# Patient Record
Sex: Female | Born: 1935 | Hispanic: No | Smoking: Never smoker
Health system: Southern US, Community
[De-identification: ages and names within clinical notes are randomized; demographics above are authoritative.]

## PROBLEM LIST (undated history)

## (undated) DIAGNOSIS — E7800 Pure hypercholesterolemia, unspecified: Secondary | ICD-10-CM

## (undated) DIAGNOSIS — K219 Gastro-esophageal reflux disease without esophagitis: Secondary | ICD-10-CM

## (undated) DIAGNOSIS — I639 Cerebral infarction, unspecified: Secondary | ICD-10-CM

---

## 2010-10-03 HISTORY — PX: COLON SURGERY: SHX602

## 2020-09-29 ENCOUNTER — Other Ambulatory Visit: Payer: Self-pay

## 2020-09-29 ENCOUNTER — Encounter (HOSPITAL_BASED_OUTPATIENT_CLINIC_OR_DEPARTMENT_OTHER): Payer: Self-pay | Admitting: *Deleted

## 2020-09-29 ENCOUNTER — Inpatient Hospital Stay (HOSPITAL_BASED_OUTPATIENT_CLINIC_OR_DEPARTMENT_OTHER)
Admission: EM | Admit: 2020-09-29 | Discharge: 2020-10-01 | DRG: 065 | Disposition: A | Discharge status: Home Health | Payer: Medicare Other | Attending: Internal Medicine | Admitting: Internal Medicine

## 2020-09-29 ENCOUNTER — Emergency Department (HOSPITAL_BASED_OUTPATIENT_CLINIC_OR_DEPARTMENT_OTHER): Payer: Medicare Other

## 2020-09-29 DIAGNOSIS — R030 Elevated blood-pressure reading, without diagnosis of hypertension: Secondary | ICD-10-CM | POA: Diagnosis present

## 2020-09-29 DIAGNOSIS — Z20822 Contact with and (suspected) exposure to covid-19: Secondary | ICD-10-CM | POA: Diagnosis present

## 2020-09-29 DIAGNOSIS — I63412 Cerebral infarction due to embolism of left middle cerebral artery: Secondary | ICD-10-CM | POA: Diagnosis not present

## 2020-09-29 DIAGNOSIS — I639 Cerebral infarction, unspecified: Secondary | ICD-10-CM

## 2020-09-29 DIAGNOSIS — I63512 Cerebral infarction due to unspecified occlusion or stenosis of left middle cerebral artery: Secondary | ICD-10-CM | POA: Diagnosis not present

## 2020-09-29 DIAGNOSIS — G8191 Hemiplegia, unspecified affecting right dominant side: Secondary | ICD-10-CM | POA: Diagnosis present

## 2020-09-29 DIAGNOSIS — R471 Dysarthria and anarthria: Secondary | ICD-10-CM | POA: Diagnosis not present

## 2020-09-29 DIAGNOSIS — R29810 Facial weakness: Secondary | ICD-10-CM | POA: Diagnosis not present

## 2020-09-29 DIAGNOSIS — I672 Cerebral atherosclerosis: Secondary | ICD-10-CM | POA: Diagnosis not present

## 2020-09-29 DIAGNOSIS — K219 Gastro-esophageal reflux disease without esophagitis: Secondary | ICD-10-CM | POA: Diagnosis present

## 2020-09-29 DIAGNOSIS — Z79899 Other long term (current) drug therapy: Secondary | ICD-10-CM | POA: Diagnosis not present

## 2020-09-29 DIAGNOSIS — R29704 NIHSS score 4: Secondary | ICD-10-CM | POA: Diagnosis present

## 2020-09-29 DIAGNOSIS — R7303 Prediabetes: Secondary | ICD-10-CM | POA: Diagnosis present

## 2020-09-29 HISTORY — DX: Gastro-esophageal reflux disease without esophagitis: K21.9

## 2020-09-29 LAB — COMPREHENSIVE METABOLIC PANEL
ALT: 16 U/L (ref 0–44)
AST: 23 U/L (ref 15–41)
Albumin: 4.1 g/dL (ref 3.5–5.0)
Alkaline Phosphatase: 65 U/L (ref 38–126)
Anion gap: 8 (ref 5–15)
BUN: 11 mg/dL (ref 8–23)
CO2: 26 mmol/L (ref 22–32)
Calcium: 8.7 mg/dL — ABNORMAL LOW (ref 8.9–10.3)
Chloride: 105 mmol/L (ref 98–111)
Creatinine, Ser: 0.81 mg/dL (ref 0.44–1.00)
GFR, Estimated: >60 mL/min (ref >60)
Glucose, Bld: 127 mg/dL — ABNORMAL HIGH (ref 70–99)
Potassium: 3.8 mmol/L (ref 3.5–5.1)
Sodium: 139 mmol/L (ref 135–145)
Total Bilirubin: 0.5 mg/dL (ref 0.3–1.2)
Total Protein: 7.2 g/dL (ref 6.5–8.1)

## 2020-09-29 LAB — DIFFERENTIAL
Abs Immature Granulocytes: 0.01 10*3/uL (ref 0.00–0.07)
Basophils Absolute: 0 10*3/uL (ref 0.0–0.1)
Basophils Relative: 0 %
Eosinophils Absolute: 0.1 10*3/uL (ref 0.0–0.5)
Eosinophils Relative: 2 %
Immature Granulocytes: 0 %
Lymphocytes Relative: 30 %
Lymphs Abs: 1.9 10*3/uL (ref 0.7–4.0)
Monocytes Absolute: 0.6 10*3/uL (ref 0.1–1.0)
Monocytes Relative: 10 %
Neutro Abs: 3.6 10*3/uL (ref 1.7–7.7)
Neutrophils Relative %: 58 %

## 2020-09-29 LAB — URINALYSIS, ROUTINE W REFLEX MICROSCOPIC
Bilirubin Urine: NEGATIVE
Glucose, UA: NEGATIVE mg/dL
Hgb urine dipstick: NEGATIVE
Ketones, ur: NEGATIVE mg/dL
Leukocytes,Ua: NEGATIVE
Nitrite: NEGATIVE
Protein, ur: NEGATIVE mg/dL
Specific Gravity, Urine: 1.005 (ref 1.005–1.030)
pH: 8 (ref 5.0–8.0)

## 2020-09-29 LAB — CBC
HCT: 39.2 % (ref 36.0–46.0)
Hemoglobin: 13.2 g/dL (ref 12.0–15.0)
MCH: 29.3 pg (ref 26.0–34.0)
MCHC: 33.7 g/dL (ref 30.0–36.0)
MCV: 87.1 fL (ref 80.0–100.0)
Platelets: 210 10*3/uL (ref 150–400)
RBC: 4.5 MIL/uL (ref 3.87–5.11)
RDW: 13.2 % (ref 11.5–15.5)
WBC: 6.2 10*3/uL (ref 4.0–10.5)
nRBC: 0 % (ref 0.0–0.2)

## 2020-09-29 LAB — RAPID URINE DRUG SCREEN, HOSP PERFORMED
Amphetamines: NOT DETECTED
Barbiturates: NOT DETECTED
Benzodiazepines: NOT DETECTED
Cocaine: NOT DETECTED
Opiates: NOT DETECTED
Tetrahydrocannabinol: NOT DETECTED

## 2020-09-29 LAB — RESP PANEL BY RT-PCR (FLU A&B, COVID) ARPGX2
Influenza A by PCR: NEGATIVE
Influenza B by PCR: NEGATIVE
SARS Coronavirus 2 by RT PCR: NEGATIVE

## 2020-09-29 LAB — TROPONIN I (HIGH SENSITIVITY): Troponin I (High Sensitivity): 4 ng/L (ref <18)

## 2020-09-29 LAB — ETHANOL: Alcohol, Ethyl (B): <10 mg/dL (ref <10)

## 2020-09-29 MED ORDER — IOHEXOL 350 MG/ML SOLN
100.0000 mL | Freq: Once | INTRAVENOUS | Status: AC | PRN
  Administered 2020-09-29: 19:00:00 100 mL via INTRAVENOUS

## 2020-09-29 MED ORDER — PANTOPRAZOLE SODIUM 40 MG PO TBEC
40.0000 mg | DELAYED_RELEASE_TABLET | Freq: Every day | ORAL | Status: DC
  Administered 2020-09-30 – 2020-10-01 (×2): 40 mg via ORAL
  Filled 2020-09-29 (×2): qty 1

## 2020-09-29 MED ORDER — ASPIRIN 325 MG PO TABS
325.0000 mg | ORAL_TABLET | Freq: Once | ORAL | Status: AC
  Administered 2020-09-30: 07:00:00 325 mg via ORAL
  Filled 2020-09-29: qty 1

## 2020-09-29 NOTE — ED Notes (Signed)
Son informed there were no admit beds available tonight and pt would remain here until tomorrow. Son verbalized understanding.

## 2020-09-29 NOTE — ED Provider Notes (Signed)
[facility] EMERGENCY DEPARTMENT Provider Note   CSN: [id number] Arrival date & time: 09/29/20  1707     History Chief Complaint  Patient presents with  . Weakness    Kelly [name] is a 86 y.o. female here with weakness.  Patient states that she has been having some congestion and cough for the last several days.  She was noted to have right-sided weakness's for the last 4 to 5 days.  She saw her doctor and was given a Z-Pak.  She also was tested for Covid yesterday and was negative.  However yesterday, the son noticed that she has a facial droop on the right side.  She also has some trouble speaking as well.  She never has a history of stroke and son was concerned that she has a stroke now.   The history is provided by the patient.  Translation done by the son      Past Medical History:  Diagnosis Date  . GERD (gastroesophageal reflux disease)     There are no problems to display for this patient.   History reviewed. No pertinent surgical history.   OB History   No obstetric history on file.     No family history on file.  Social History   Tobacco Use  . Smoking status: Never Smoker  . Smokeless tobacco: Never Used  Substance Use Topics  . Alcohol use: Yes  . Drug use: Never    Home Medications Prior to Admission medications   Medication Sig Start Date End Date Taking? Authorizing Provider  pantoprazole (PROTONIX) 40 MG tablet TAKE ONE TABLET TWICE DAILY 30 MINUTES BEFORE AM/PM MEALS 11/11/19  Yes [provider]    Allergies    Patient has no known allergies.  Review of Systems   Review of Systems  Neurological: Positive for speech difficulty and weakness.  All other systems reviewed and are negative.   Physical Exam Updated Vital Signs BP (!) 159/70 (BP Location: Right Arm)   Pulse 76   Temp 98.2 F (36.8 C) (Oral)   Resp 17   SpO2 97%   Physical Exam Vitals and nursing note reviewed.  Constitutional:      Appearance: Normal  appearance.  HENT:     Head: Normocephalic.     Nose: Nose normal.     Mouth/Throat:     Mouth: Mucous membranes are moist.  Eyes:     Extraocular Movements: Extraocular movements intact.     Pupils: Pupils are equal, round, and reactive to light.  Cardiovascular:     Rate and Rhythm: Normal rate and regular rhythm.     Pulses: Normal pulses.     Heart sounds: Normal heart sounds.  Pulmonary:     Effort: Pulmonary effort is normal.     Breath sounds: Normal breath sounds.  Abdominal:     General: Abdomen is flat.     Palpations: Abdomen is soft.  Musculoskeletal:        General: Normal range of motion.     Cervical back: Normal range of motion and neck supple.  Skin:    General: Skin is warm.     Capillary Refill: Capillary refill takes less than 2 seconds.  Neurological:     Mental Status: She is alert.     Comments: R facial droop, R pronator drift, + slurred speech per the son.   Psychiatric:        Mood and Affect: Mood normal.  Behavior: Behavior normal.     ED Results / Procedures / Treatments   Labs (all labs ordered are listed, but only abnormal results are displayed) Labs Reviewed  COMPREHENSIVE METABOLIC PANEL - Abnormal; Notable for the following components:      Result Value   Glucose, Bld 127 (*)    Calcium 8.7 (*)    All other components within normal limits  RESP PANEL BY RT-PCR (FLU A&B, COVID) ARPGX2  ETHANOL  CBC  DIFFERENTIAL  RAPID URINE DRUG SCREEN, HOSP PERFORMED  URINALYSIS, ROUTINE W REFLEX MICROSCOPIC  TROPONIN I (HIGH SENSITIVITY)    EKG None  Radiology CT Angio Head W or Wo Contrast  Result Date: 09/29/2020 CLINICAL DATA:  Right-sided weakness EXAM: CT ANGIOGRAPHY HEAD AND NECK TECHNIQUE: Multidetector CT imaging of the head and neck was performed using the standard protocol during bolus administration of intravenous contrast. Multiplanar CT image reconstructions and MIPs were obtained to evaluate the vascular anatomy.  Carotid stenosis measurements (when applicable) are obtained utilizing NASCET criteria, using the distal internal carotid diameter as the denominator. CONTRAST:  100mL OMNIPAQUE IOHEXOL 350 MG/ML SOLN COMPARISON:  None. FINDINGS: CTA NECK FINDINGS SKELETON: There is no bony spinal canal stenosis. No lytic or blastic lesion. OTHER NECK: Normal pharynx, larynx and major salivary glands. No cervical lymphadenopathy. Unremarkable thyroid gland. UPPER CHEST: No pneumothorax or pleural effusion. No nodules or masses. AORTIC ARCH: There is calcific atherosclerosis of the aortic arch. There is no aneurysm, dissection or hemodynamically significant stenosis of the visualized portion of the aorta. Conventional 3 vessel aortic branching pattern. The visualized proximal subclavian arteries are widely patent. RIGHT CAROTID SYSTEM: Normal without aneurysm, dissection or stenosis. LEFT CAROTID SYSTEM: Normal without aneurysm, dissection or stenosis. VERTEBRAL ARTERIES: Left dominant configuration. Both origins are clearly patent. There is no dissection, occlusion or flow-limiting stenosis to the skull base (V1-V3 segments). CTA HEAD FINDINGS POSTERIOR CIRCULATION: --Vertebral arteries: Normal V4 segments. --Inferior cerebellar arteries: Normal. 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 --Superior cerebellar arteries: Normal. --Posterior cerebral arteries (PCA): Moderate left P1 P2 junction stenosis. Normal right PCA. ANTERIOR CIRCULATION: --Intracranial internal carotid arteries: Normal. --Anterior cerebral arteries (ACA): Normal. Both A1 segments are present. Patent anterior communicating artery (a-comm). --Middle cerebral arteries (MCA): There is occlusion of the left M1 segment proximally. There is good collateral flow with patency of most M2 branches. The right MCA is mildly narrowed at the M1 M2 junction. VENOUS SINUSES: As permitted by contrast timing, patent. ANATOMIC VARIANTS: None Review  of the MIP images confirms the above findings. IMPRESSION: 1. 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 Critical Value/emergent results were called by telephone at the time of interpretation on 09/29/2020 at 7:16 pm to provider Hill Mackie , who verbally acknowledged these results. 2. 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 3. Moderate left PCA P1 P2 junction stenosis. Aortic Atherosclerosis (ICD10-I70.0). Electronically Signed   By: Deatra RobinsonKevin  Herman M.D.   On: 09/29/2020 19:17   DG Chest 2 View  Result Date: 09/29/2020 CLINICAL DATA:  Weakness EXAM: CHEST - 2 VIEW COMPARISON:  12/03/2012 FINDINGS: Streaky atelectasis or scar at the lingula. No focal consolidation or effusion. Stable cardiomediastinal silhouette. Aortic atherosclerosis. No pneumothorax. IMPRESSION: Streaky atelectasis or scar at the lingula. Electronically Signed   By: Jasmine PangKim  Fujinaga M.D.   On: 09/29/2020 19:11   CT Angio Neck W and/or Wo Contrast  Result Date: 09/29/2020 CLINICAL DATA:  Right-sided weakness EXAM: CT ANGIOGRAPHY  HEAD AND NECK TECHNIQUE: Multidetector CT imaging of the head and neck was performed using the standard protocol during bolus administration of intravenous contrast. Multiplanar CT image reconstructions and MIPs were obtained to evaluate the vascular anatomy. Carotid stenosis measurements (when applicable) are obtained utilizing NASCET criteria, using the distal internal carotid diameter as the denominator. CONTRAST:  OMNIPAQUE IOHEXOL 350 MG/ML SOLN COMPARISON:  None. FINDINGS: CTA NECK FINDINGS SKELETON: There is no bony spinal canal stenosis. No lytic or blastic lesion. OTHER NECK: Normal pharynx, larynx and major salivary glands. No cervical lymphadenopathy. Unremarkable thyroid gland. UPPER CHEST: No pneumothorax or pleural effusion. No nodules or masses. AORTIC ARCH: There is calcific atherosclerosis of the aortic arch. There is no aneurysm, dissection or  hemodynamically significant stenosis of the visualized portion of the aorta. Conventional 3 vessel aortic branching pattern. The visualized proximal subclavian arteries are widely patent. RIGHT CAROTID SYSTEM: Normal without aneurysm, dissection or stenosis. LEFT CAROTID SYSTEM: Normal without aneurysm, dissection or stenosis. VERTEBRAL ARTERIES: Left dominant configuration. Both origins are clearly patent. There is no dissection, occlusion or flow-limiting stenosis to the skull base (V1-V3 segments). CTA HEAD FINDINGS POSTERIOR CIRCULATION: --Vertebral arteries: Normal V4 segments. --Inferior cerebellar arteries: Normal. 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 --Superior cerebellar arteries: Normal. --Posterior cerebral arteries (PCA): Moderate left P1 P2 junction stenosis. Normal right PCA. ANTERIOR CIRCULATION: --Intracranial internal carotid arteries: Normal. --Anterior cerebral arteries (ACA): Normal. Both A1 segments are present. Patent anterior communicating artery (a-comm). --Middle cerebral arteries (MCA): There is occlusion of the left M1 segment proximally. There is good collateral flow with patency of most M2 branches. The right MCA is mildly narrowed at the M1 M2 junction. VENOUS SINUSES: As permitted by contrast timing, patent. ANATOMIC VARIANTS: None Review of the MIP images confirms the above findings. IMPRESSION: 1. 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 Critical Value/emergent results were called by telephone at the time of interpretation on 09/29/2020 at 7:16 pm to provider Onetta Spainhower , who verbally acknowledged these results. 2. 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 3. Moderate left PCA P1 P2 junction stenosis. Aortic Atherosclerosis (ICD10-I70.0). Electronically Signed   By: Deatra Robinson M.D.   On: 09/29/2020 19:17    Procedures Procedures (including critical care time)  Medications Ordered in  ED Medications  iohexol (OMNIPAQUE) 350 MG/ML injection 100 mL (100 mLs Intravenous Contrast Given 09/29/20 1848)    ED Course  I have reviewed the triage vital signs and the nursing notes.  Pertinent labs & imaging results that were available during my care of the patient were reviewed by me and considered in my medical decision making (see chart for details).    MDM Rules/Calculators/A&P                          Kelly [name] is a 86 y.o. female here with weakness, slurred speech. She has R facial droop and R pronator drift as well. Symptoms worsening over 3-4 days, not in the window for TPA. Will get CTA head/neck, will likely need admission for stroke workup   7:27 PM Labs unremarkable. CTA showed L M1 occlusion with collateralization. Symptoms for 4-5 days already. Talked to Dr. Wilford Corner from neurology. He reviewed the scans and doesn't recommend any acute intervention and patient has collaterals already so is not interventional candidate. Hospitalist to admit at [facility] for stroke workup    Final Clinical Impression(s) / ED  Diagnoses Final diagnoses:  None    Rx / DC Orders ED Discharge Orders    None       Charlynne Pander, MD 09/29/20 [phone]

## 2020-09-29 NOTE — ED Triage Notes (Signed)
4 days ago her son states it thinks she had a stroke. It started as a cough and runny nose. She had a virtual visit by her MD and given a Zpak. Her Covid was negative. She has not been using her right arm or talking since Friday.

## 2020-09-30 ENCOUNTER — Observation Stay (HOSPITAL_COMMUNITY): Payer: Medicare Other

## 2020-09-30 DIAGNOSIS — Z20822 Contact with and (suspected) exposure to covid-19: Secondary | ICD-10-CM | POA: Diagnosis not present

## 2020-09-30 DIAGNOSIS — R030 Elevated blood-pressure reading, without diagnosis of hypertension: Secondary | ICD-10-CM | POA: Diagnosis not present

## 2020-09-30 DIAGNOSIS — Z79899 Other long term (current) drug therapy: Secondary | ICD-10-CM | POA: Diagnosis not present

## 2020-09-30 DIAGNOSIS — K219 Gastro-esophageal reflux disease without esophagitis: Secondary | ICD-10-CM | POA: Diagnosis present

## 2020-09-30 DIAGNOSIS — G8191 Hemiplegia, unspecified affecting right dominant side: Secondary | ICD-10-CM | POA: Diagnosis not present

## 2020-09-30 DIAGNOSIS — R29704 NIHSS score 4: Secondary | ICD-10-CM | POA: Diagnosis not present

## 2020-09-30 DIAGNOSIS — I639 Cerebral infarction, unspecified: Secondary | ICD-10-CM

## 2020-09-30 DIAGNOSIS — R471 Dysarthria and anarthria: Secondary | ICD-10-CM | POA: Diagnosis not present

## 2020-09-30 DIAGNOSIS — R7303 Prediabetes: Secondary | ICD-10-CM | POA: Diagnosis present

## 2020-09-30 DIAGNOSIS — I672 Cerebral atherosclerosis: Secondary | ICD-10-CM | POA: Diagnosis not present

## 2020-09-30 DIAGNOSIS — R29810 Facial weakness: Secondary | ICD-10-CM | POA: Diagnosis not present

## 2020-09-30 DIAGNOSIS — I63412 Cerebral infarction due to embolism of left middle cerebral artery: Secondary | ICD-10-CM | POA: Diagnosis not present

## 2020-09-30 DIAGNOSIS — I63512 Cerebral infarction due to unspecified occlusion or stenosis of left middle cerebral artery: Secondary | ICD-10-CM | POA: Diagnosis not present

## 2020-09-30 LAB — HEMOGLOBIN A1C
Hgb A1c MFr Bld: 6 % — ABNORMAL HIGH (ref 4.8–5.6)
Mean Plasma Glucose: 125.5 mg/dL

## 2020-09-30 LAB — LIPID PANEL
Cholesterol: 254 mg/dL — ABNORMAL HIGH (ref 0–200)
HDL: 41 mg/dL (ref >40)
LDL Cholesterol: 180 mg/dL — ABNORMAL HIGH (ref 0–99)
Total CHOL/HDL Ratio: 6.2 RATIO
Triglycerides: 167 mg/dL — ABNORMAL HIGH (ref <150)
VLDL: 33 mg/dL (ref 0–40)

## 2020-09-30 MED ORDER — HYDRALAZINE HCL 20 MG/ML IJ SOLN: 10.0000 mg | INTRAMUSCULAR | Status: DC | PRN

## 2020-09-30 MED ORDER — ASPIRIN EC 81 MG PO TBEC: 81.0000 mg | DELAYED_RELEASE_TABLET | Freq: Every day | ORAL | Status: DC

## 2020-09-30 MED ORDER — SODIUM CHLORIDE 0.9 % IV SOLN: Freq: Once | INTRAVENOUS | Status: AC

## 2020-09-30 MED ORDER — ACETAMINOPHEN 325 MG PO TABS: 650.0000 mg | ORAL_TABLET | ORAL | Status: DC | PRN

## 2020-09-30 MED ORDER — ENOXAPARIN SODIUM 40 MG/0.4ML ~~LOC~~ SOLN
40.0000 mg | SUBCUTANEOUS | Status: DC
  Administered 2020-10-01: 18:00:00 40 mg via SUBCUTANEOUS
  Filled 2020-09-30: qty 0.4

## 2020-09-30 MED ORDER — ACETAMINOPHEN 650 MG RE SUPP: 650.0000 mg | RECTAL | Status: DC | PRN

## 2020-09-30 MED ORDER — CALCIUM GLUCONATE-NACL 1-0.675 GM/50ML-% IV SOLN
1.0000 g | Freq: Once | INTRAVENOUS | Status: AC
  Administered 2020-09-30: 21:00:00 1000 mg via INTRAVENOUS
  Filled 2020-09-30: qty 50

## 2020-09-30 MED ORDER — STROKE: EARLY STAGES OF RECOVERY BOOK
Freq: Once | Status: DC
  Filled 2020-09-30: qty 1

## 2020-09-30 MED ORDER — ACETAMINOPHEN 160 MG/5ML PO SOLN
650.0000 mg | ORAL | Status: DC | PRN
Start: 2020-09-30 — End: 2020-10-01

## 2020-09-30 NOTE — H&P (Addendum)
History and Physical    Kelly [name] VZD:[id number] DOB: 06/17/1934 DOA: 09/29/2020  Referring MD/NP/PA: Victorino Dike, MD PCP: Patient, No Pcp Per  Patient coming from: Transfer from [facility]  Chief Complaint:   weakness  I have personally briefly reviewed patient's old medical records in [facility]   HPI: Kelly [name] is a 86 y.o. female with medical history significant of GERD who present with reports of weakness.  History is difficult to obtain from the patient due to slurred speech even with use of interpreter services.  Patient's son is able to provide some history.  Patient had been having weakness on her right side for last 5 days.  Prior to this she had been complaining of cough and congestion and had been prescribed a Z-Pak by her provider.  One of the customers had made him aware of his mother's symptoms yesterday.  When he came to check on her he noticed that she had some right-sided facial droop and was having difficulty speaking.  He took her to the emergency department for further evaluation.  ED Course: On emergency department patient was noted to be afebrile with blood pressures 127/93-192/81, and all other vital signs maintained.  Labs including CBC, CMP, and troponin are all relatively within normal limits.  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  Neurology have been formally consulted.  Patient was given 325 mg aspirin. TRH called to admit.   Review of Systems  Unable to perform ROS: Other  Constitutional: Positive for malaise/fatigue.  HENT: Positive for congestion.   Respiratory: Positive for cough.   Gastrointestinal: Negative for abdominal pain, diarrhea, nausea and vomiting.  Neurological: Positive for focal weakness.  Psychiatric/Behavioral: Negative for substance abuse.    Past Medical History:   Diagnosis Date  . GERD (gastroesophageal reflux disease)     History reviewed. No pertinent surgical history.   reports that she has never smoked. She has never used smokeless tobacco. She reports current alcohol use. She reports that she does not use drugs.  No Known Allergies  No family history on file.  Prior to Admission medications   Medication Sig Start Date End Date Taking? Authorizing Provider  pantoprazole (PROTONIX) 40 MG tablet TAKE ONE TABLET TWICE DAILY 30 MINUTES BEFORE AM/PM MEALS 11/11/19  Yes [provider]    Physical Exam:  Constitutional: elderly female no acute distress Vitals:   09/30/20 1330 09/30/20 1400 09/30/20 1430 09/30/20 1500  BP: (!) 165/99 (!) 150/72 (!) 159/77 (!) 152/83  Pulse: 92 70 65 87  Resp: 15 13 13 15   Temp:      TempSrc:      SpO2: 96% 96% 95% 96%  Weight:      Height:       Eyes: PERRL, lids and conjunctivae normal ENMT: Mucous membranes are dry. Posterior pharynx clear of any exudate or lesions. Right-sided facial droop appreciated Neck: normal, supple, no masses, no thyromegaly Respiratory: clear to auscultation bilaterally, no wheezing, no crackles. Normal respiratory effort. No accessory muscle use.  Cardiovascular: Regular rate and rhythm, no murmurs / rubs / gallops. No extremity edema. 2+ pedal pulses. No carotid bruits.  Abdomen: no tenderness, no masses palpated. No hepatosplenomegaly. Bowel sounds positive.  Musculoskeletal: no clubbing / cyanosis. No joint deformity upper and lower extremities. Good ROM, no contractures. Normal muscle tone.  Skin: no rashes,  lesions, ulcers. No induration Neurologic: CN 2-12 grossly intact. Sensation intact, DTR normal. Strength 3-4/5 on the right upper extremity.  Strength appears to be 5/5 on the left upper and lower extreme.  Speech is slurred. Psychiatric: Normal judgment and insight. Alert and oriented x 3. Normal mood.     Labs on Admission: I have personally reviewed  following labs and imaging studies  CBC: Recent Labs  Lab 09/29/20 1751  WBC 6.2  NEUTROABS 3.6  HGB 13.2  HCT 39.2  MCV 87.1  PLT 210   Basic Metabolic Panel: Recent Labs  Lab 09/29/20 1751  NA 139  K 3.8  CL 105  CO2 26  GLUCOSE 127*  BUN 11  CREATININE 0.81  CALCIUM 8.7*   GFR: Estimated Creatinine Clearance: 46.2 mL/min (by C-G formula based on SCr of 0.81 mg/dL). Liver Function Tests: Recent Labs  Lab 09/29/20 1751  AST 23  ALT 16  ALKPHOS 65  BILITOT 0.5  PROT 7.2  ALBUMIN 4.1   No results for input(s): LIPASE, AMYLASE in the last 168 hours. No results for input(s): AMMONIA in the last 168 hours. Coagulation Profile: No results for input(s): INR, PROTIME in the last 168 hours. Cardiac Enzymes: No results for input(s): CKTOTAL, CKMB, CKMBINDEX, TROPONINI in the last 168 hours. BNP (last 3 results) No results for input(s): PROBNP in the last 8760 hours. HbA1C: No results for input(s): HGBA1C in the last 72 hours. CBG: No results for input(s): GLUCAP in the last 168 hours. Lipid Profile: No results for input(s): CHOL, HDL, LDLCALC, TRIG, CHOLHDL, LDLDIRECT in the last 72 hours. Thyroid Function Tests: No results for input(s): TSH, T4TOTAL, FREET4, T3FREE, THYROIDAB in the last 72 hours. Anemia Panel: No results for input(s): VITAMINB12, FOLATE, FERRITIN, TIBC, IRON, RETICCTPCT in the last 72 hours. Urine analysis:    Component Value Date/Time   COLORURINE YELLOW 09/29/2020 2215   APPEARANCEUR CLEAR 09/29/2020 2215   LABSPEC 1.005 09/29/2020 2215   PHURINE 8.0 09/29/2020 2215   GLUCOSEU NEGATIVE 09/29/2020 2215   HGBUR NEGATIVE 09/29/2020 2215   BILIRUBINUR NEGATIVE 09/29/2020 2215   KETONESUR NEGATIVE 09/29/2020 2215   PROTEINUR NEGATIVE 09/29/2020 2215   NITRITE NEGATIVE 09/29/2020 2215   LEUKOCYTESUR NEGATIVE 09/29/2020 2215   Sepsis Labs: Recent Results (from the past 240 hour(s))  Resp Panel by RT-PCR (Flu A&B, Covid) Nasopharyngeal  Swab     Status: None   Collection Time: 09/29/20  5:51 PM   Specimen: Nasopharyngeal Swab; Nasopharyngeal(NP) swabs in vial transport medium  Result Value Ref Range Status   SARS Coronavirus 2 by RT PCR NEGATIVE NEGATIVE Final    Comment: (NOTE) SARS-CoV-2 target nucleic acids are NOT DETECTED.  The SARS-CoV-2 RNA is generally detectable in upper respiratory specimens during the acute phase of infection. The lowest concentration of SARS-CoV-2 viral copies this assay can detect is 138 copies/mL. A negative result does not preclude SARS-Cov-2 infection and should not be used as the sole basis for treatment or other patient management decisions. A negative result may occur with  improper specimen collection/handling, submission of specimen other than nasopharyngeal swab, presence of viral mutation(s) within the areas targeted by this assay, and inadequate number of viral copies(<138 copies/mL). A negative result must be combined with clinical observations, patient history, and epidemiological information. The expected result is Negative.  Fact Sheet for Patients:  BloggerCourse.com  Fact Sheet for Healthcare Providers:  SeriousBroker.it  This test is no t yet approved or cleared by the [place] and  has been authorized for detection and/or diagnosis of SARS-CoV-2 by FDA under an Emergency Use Authorization (EUA). This EUA will remain  in effect (meaning this test can be used) for the duration of the COVID-19 declaration under Section 564(b)(1) of the Act, 21 U.S.C.section 360bbb-3(b)(1), unless the authorization is terminated  or revoked sooner.       Influenza A by PCR NEGATIVE NEGATIVE Final   Influenza B by PCR NEGATIVE NEGATIVE Final    Comment: (NOTE) The Xpert Xpress SARS-CoV-2/FLU/RSV plus assay is intended as an aid in the diagnosis of influenza from Nasopharyngeal swab specimens and should not be used as a sole  basis for treatment. Nasal washings and aspirates are unacceptable for Xpert Xpress SARS-CoV-2/FLU/RSV testing.  Fact Sheet for Patients: BloggerCourse.com  Fact Sheet for Healthcare Providers: SeriousBroker.it  This test is not yet approved or cleared by the [place] FDA and has been authorized for detection and/or diagnosis of SARS-CoV-2 by FDA under an Emergency Use Authorization (EUA). This EUA will remain in effect (meaning this test can be used) for the duration of the COVID-19 declaration under Section 564(b)(1) of the Act, 21 U.S.C. section 360bbb-3(b)(1), unless the authorization is terminated or revoked.  Performed at [facility], [address] [place], [place] [address]      Radiological Exams on Admission: CT Angio Head W or Wo Contrast  Result Date: 09/29/2020 CLINICAL DATA:  Right-sided weakness EXAM: CT ANGIOGRAPHY HEAD AND NECK TECHNIQUE: Multidetector CT imaging of the head and neck was performed using the standard protocol during bolus administration of intravenous contrast. Multiplanar CT image reconstructions and MIPs were obtained to evaluate the vascular anatomy. Carotid stenosis measurements (when applicable) are obtained utilizing NASCET criteria, using the distal internal carotid diameter as the denominator. CONTRAST:  OMNIPAQUE IOHEXOL 350 MG/ML SOLN COMPARISON:  None. FINDINGS: CTA NECK FINDINGS SKELETON: There is no bony spinal canal stenosis. No lytic or blastic lesion. OTHER NECK: Normal pharynx, larynx and major salivary glands. No cervical lymphadenopathy. Unremarkable thyroid gland. UPPER CHEST: No pneumothorax or pleural effusion. No nodules or masses. AORTIC ARCH: There is calcific atherosclerosis of the aortic arch. There is no aneurysm, dissection or hemodynamically significant stenosis of the visualized portion of the aorta. Conventional 3 vessel aortic branching pattern. The  visualized proximal subclavian arteries are widely patent. RIGHT CAROTID SYSTEM: Normal without aneurysm, dissection or stenosis. LEFT CAROTID SYSTEM: Normal without aneurysm, dissection or stenosis. VERTEBRAL ARTERIES: Left dominant configuration. Both origins are clearly patent. There is no dissection, occlusion or flow-limiting stenosis to the skull base (V1-V3 segments). CTA HEAD FINDINGS POSTERIOR CIRCULATION: --Vertebral arteries: Normal V4 segments. --Inferior cerebellar arteries: Normal. 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 --Superior cerebellar arteries: Normal. --Posterior cerebral arteries (PCA): Moderate left P1 P2 junction stenosis. Normal right PCA. ANTERIOR CIRCULATION: --Intracranial internal carotid arteries: Normal. --Anterior cerebral arteries (ACA): Normal. Both A1 segments are present. Patent anterior communicating artery (a-comm). --Middle cerebral arteries (MCA): There is occlusion of the left M1 segment proximally. There is good collateral flow with patency of most M2 branches. The right MCA is mildly narrowed at the M1 M2 junction. VENOUS SINUSES: As permitted by contrast timing, patent. ANATOMIC VARIANTS: None Review of the MIP images confirms the above findings. IMPRESSION: 1. 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 Critical Value/emergent results were called by telephone at the time of interpretation on 09/29/2020 at 7:16 pm to provider DAVID YAO , who verbally acknowledged these  results. 2. 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 3. Moderate left PCA P1 P2 junction stenosis. Aortic Atherosclerosis (ICD10-I70.0). Electronically Signed   By: Deatra Robinson M.D.   On: 09/29/2020 19:17   DG Chest 2 View  Result Date: 09/29/2020 CLINICAL DATA:  Weakness EXAM: CHEST - 2 VIEW COMPARISON:  12/03/2012 FINDINGS: Streaky atelectasis or scar at the lingula. No focal consolidation or effusion. Stable  cardiomediastinal silhouette. Aortic atherosclerosis. No pneumothorax. IMPRESSION: Streaky atelectasis or scar at the lingula. Electronically Signed   By: Jasmine Pang M.D.   On: 09/29/2020 19:11   CT Angio Neck W and/or Wo Contrast  Result Date: 09/29/2020 CLINICAL DATA:  Right-sided weakness EXAM: CT ANGIOGRAPHY HEAD AND NECK TECHNIQUE: Multidetector CT imaging of the head and neck was performed using the standard protocol during bolus administration of intravenous contrast. Multiplanar CT image reconstructions and MIPs were obtained to evaluate the vascular anatomy. Carotid stenosis measurements (when applicable) are obtained utilizing NASCET criteria, using the distal internal carotid diameter as the denominator. CONTRAST:  OMNIPAQUE IOHEXOL 350 MG/ML SOLN COMPARISON:  None. FINDINGS: CTA NECK FINDINGS SKELETON: There is no bony spinal canal stenosis. No lytic or blastic lesion. OTHER NECK: Normal pharynx, larynx and major salivary glands. No cervical lymphadenopathy. Unremarkable thyroid gland. UPPER CHEST: No pneumothorax or pleural effusion. No nodules or masses. AORTIC ARCH: There is calcific atherosclerosis of the aortic arch. There is no aneurysm, dissection or hemodynamically significant stenosis of the visualized portion of the aorta. Conventional 3 vessel aortic branching pattern. The visualized proximal subclavian arteries are widely patent. RIGHT CAROTID SYSTEM: Normal without aneurysm, dissection or stenosis. LEFT CAROTID SYSTEM: Normal without aneurysm, dissection or stenosis. VERTEBRAL ARTERIES: Left dominant configuration. Both origins are clearly patent. There is no dissection, occlusion or flow-limiting stenosis to the skull base (V1-V3 segments). CTA HEAD FINDINGS POSTERIOR CIRCULATION: --Vertebral arteries: Normal V4 segments. --Inferior cerebellar arteries: Normal. 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 --Superior cerebellar arteries: Normal.  --Posterior cerebral arteries (PCA): Moderate left P1 P2 junction stenosis. Normal right PCA. ANTERIOR CIRCULATION: --Intracranial internal carotid arteries: Normal. --Anterior cerebral arteries (ACA): Normal. Both A1 segments are present. Patent anterior communicating artery (a-comm). --Middle cerebral arteries (MCA): There is occlusion of the left M1 segment proximally. There is good collateral flow with patency of most M2 branches. The right MCA is mildly narrowed at the M1 M2 junction. VENOUS SINUSES: As permitted by contrast timing, patent. ANATOMIC VARIANTS: None Review of the MIP images confirms the above findings. IMPRESSION: 1. 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 Critical Value/emergent results were called by telephone at the time of interpretation on 09/29/2020 at 7:16 pm to provider DAVID YAO , who verbally acknowledged these results. 2. 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 3. Moderate left PCA P1 P2 junction stenosis. Aortic Atherosclerosis (ICD10-I70.0). Electronically Signed   By: Deatra Robinson M.D.   On: 09/29/2020 19:17      Assessment/Plan Suspected cerebral vascular accident: Subacute.  Patient presents with a 5-day history of right-sided weakness, facial droop, and dysarthria.Marland Kitchen  CTA significant for occlusion of the left MCA. -Admit to a medical telemetry -Stroke order set initiated -Neuro checks -Check  MRI/MRA head w/o contrast   -PT/OT/Speech to eval and treat -Check echocardiogram -Follow-up lipid panel  -ASA -Appreciate neurology consultative services, will follow-up  -Social work consult   Elevated blood pressure: Blood pressures noted to be elevated up to 192/81.  Patient  not on any blood pressure medications. -Continue to monitor and start antihypertensive medications when medically appropriate  -Hydralazine IV as needed  Prediabetes: Acute.  Hemoglobin A1c noted to be 6. -Continue to monitor in the  outpatient setting  GERD: Home medications include Protonix 40 mg daily. -Continue PPI  DVT prophylaxis: Lovenox Code Status: Full Family Communication: Son updated Disposition Plan: To be determined Consults called: Neurology Admission status: Observation but will like need to convert inpatient status if found to have stroke  Clydie Braunondell A Aliena Ghrist MD [facility]   If 7PM-7AM, please contact night-coverage   09/30/2020, 4:18 PM

## 2020-09-30 NOTE — Consult Note (Signed)
Neurology Consultation  CC: right-sided weakness for 5 days  History is obtained from: patient son at bedside and patient  HPI: Kelly [name] is a 86 y.o. female with a history of GERD who presented to [facility] 12/28 with complaints of right-sided weakness for 5 days. Her son states his mother was staying at his brother's house and when he picked her up, she was complaining of feeling tired with a cough and a runny nose. He suspected the flu and was giving her cold and flu medication. She was seen in her car 12/27 by her PCP for a COVID swab and was given a z-pack but was not assessed at this time. Her son had noticed her right-sided weakness and acute inability to ambulate without assistance for 5 days but was not aware that these were stroke symptoms. A customer at his restaurant mentioned yesterday that right-sided weakness is a sign of stroke and he decided to bring her to the ED.   LKW: 12/24 pm tpa given?: No, outside of time window IR Thrombectomy? No, 3 days of symptoms Premorbid mRS: 0  ROS: A complete ROS was performed and is negative except as noted in the HPI.   Past Medical History:  Diagnosis Date  . GERD (gastroesophageal reflux disease)    No family history on file.  Social History:  reports that she has never smoked. She has never used smokeless tobacco. She reports current alcohol use. She reports that she does not use drugs. Current Scheduled Medications: .  stroke: mapping our early stages of recovery book   Does not apply Once  . aspirin EC  81 mg Oral Daily  . enoxaparin (LOVENOX) injection  40 mg Subcutaneous Q24H  . pantoprazole  40 mg Oral Daily   Current PRN Medications: acetaminophen **OR** acetaminophen (TYLENOL) oral liquid 160 mg/5 mL **OR** acetaminophen, hydrALAZINE  Exam: Current vital signs: BP (!) 174/82 (BP Location: Right Arm)   Pulse 70   Temp 98.2 F (36.8 C) (Oral)   Resp 18   Ht 5\' 1"  (1.549 m)   Wt 69.7 kg   SpO2 97%   BMI 29.04 kg/m    Physical Exam  Constitutional: Appears well-developed.  Psych: Affect appropriate to situation HENT: Normocephalic/atraumatic, right-sided facial droop Cardiovascular: extremities warm without edema Respiratory: Effort normal, non-labored breathing.  GI: Soft.  Non-distended Skin: WDI  Neuro: Mental Status: Patient is awake, not oriented to year, day, month, place. She does follow commands intermittently but has trouble understanding the commands with receptive aphasia. Patient is unable to give a clear and coherent history. Patient is dysarthric with signs of expressive and receptive aphasia. No signs of neglect. Naming and comprehension are not intact; calls mask "blue" and struggles with word finding. Language assessment performed with assistance of son who speaks her dialect of .  Cranial Nerves: II: Blink to threat absent on the right. Pupils are equal, round, and reactive to light 56mm/brisk III,IV, VI: EOMI  V: Hyperesthesia to cold on the right. VII: Facial movement is asymmetric with right-sided facial droop VIII: Hearing is intact to voice X, XI: Phonation intact, shoulder shrug symmetric XII: Tongue protrudes midline Motor: Tone is normal. Bulk is normal.  RUE: weaker distally. Proximal right arm 4-/5, right hand 3/5 with minimal grip strength, upper right arm 4/5 strength.  Left upper and lower extremities and right lower extremity 5/5 strength.  Sensory: Sensation is symmetric to light touch and temperature in the arms and legs. Deep Tendon Reflexes: 1+ and symmetric in  the brachioradialis and biceps and 3+ in bilateral patellae.  Plantars: Toes are downgoing bilaterally.  Cerebellar: FNF and HKS are intact bilaterally consistent with her level of weakness   I have reviewed the images obtained:  CTA of head and neck: 1. 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  2. Moderate stenosis of the midportion of the basilar  artery. 3. Moderate left PCA P1 P2 junction stenosis.  MRI brain/MRA head: 1. Acute/subacute infarcts involving the left basal ganglia and corona radiata with additional focus of restricted diffusion in the left precentral gyrus. 2. Occlusion of the proximal left M1/MCA, as seen on prior CT angiogram. 3. Short segment occlusion of the proximal right M2/MCA posterior division branch. 4. Luminal irregularity of the bilateral posterior cerebral arteries, consistent with intracranial atherosclerotic disease with moderate stenosis at the left P1-P2/PCA junction. 5. Moderate stenosis at the terminus of the right internal carotid artery.   Assessment: 86 y.o. female with a history of GERD who presented to [facility] 12/28 with complaints of right-sided weakness for 5 days. Her son had noticed her right-sided weakness and acute inability to ambulate without assistance for 5 days but was not aware that these were stroke symptoms. A customer at his restaurant mentioned yesterday that right-sided weakness is a sign of stroke and he decided to bring her to the ED.  1. Exam reveals findings referable to the left basal ganglia and corona radiata stroke seen on MRI 2. MRI brain also reveals additional focus of restricted diffusion in the left precentral gyrus. 3. CTA of head and neck: Occlusion of the left MCA M1 segment proximally with good collateralization. 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 Moderate left PCA P1 P2 junction stenosis.  Recommendations: 1. HgbA1c, fasting lipid panel 2. TTE 3. Cardiac telemetry 4. PT consult, OT consult, Speech consult 5. Has been started on ASA 6. Frequent neuro checks 7. BP management   Exam performed by attending with findings documented by Neurology NP: Lanae Boast, AGAC-NP [facility] Pager: [phone]  Electronically signed: Dr. Caryl Pina

## 2020-09-30 NOTE — ED Notes (Signed)
Son signed consent to transfer.  Information regarding transfer and admission discussed with son and son translated to pt.  She understands.

## 2020-10-01 ENCOUNTER — Observation Stay (HOSPITAL_COMMUNITY): Payer: Medicare Other

## 2020-10-01 DIAGNOSIS — I639 Cerebral infarction, unspecified: Secondary | ICD-10-CM | POA: Diagnosis present

## 2020-10-01 DIAGNOSIS — I6389 Other cerebral infarction: Secondary | ICD-10-CM

## 2020-10-01 DIAGNOSIS — I63412 Cerebral infarction due to embolism of left middle cerebral artery: Secondary | ICD-10-CM | POA: Diagnosis not present

## 2020-10-01 DIAGNOSIS — I679 Cerebrovascular disease, unspecified: Secondary | ICD-10-CM

## 2020-10-01 DIAGNOSIS — I672 Cerebral atherosclerosis: Secondary | ICD-10-CM | POA: Diagnosis not present

## 2020-10-01 DIAGNOSIS — G8191 Hemiplegia, unspecified affecting right dominant side: Secondary | ICD-10-CM | POA: Diagnosis not present

## 2020-10-01 DIAGNOSIS — Z79899 Other long term (current) drug therapy: Secondary | ICD-10-CM | POA: Diagnosis not present

## 2020-10-01 DIAGNOSIS — R471 Dysarthria and anarthria: Secondary | ICD-10-CM | POA: Diagnosis not present

## 2020-10-01 DIAGNOSIS — I63512 Cerebral infarction due to unspecified occlusion or stenosis of left middle cerebral artery: Secondary | ICD-10-CM | POA: Diagnosis not present

## 2020-10-01 DIAGNOSIS — K219 Gastro-esophageal reflux disease without esophagitis: Secondary | ICD-10-CM | POA: Diagnosis not present

## 2020-10-01 DIAGNOSIS — Z20822 Contact with and (suspected) exposure to covid-19: Secondary | ICD-10-CM | POA: Diagnosis not present

## 2020-10-01 DIAGNOSIS — R7303 Prediabetes: Secondary | ICD-10-CM | POA: Diagnosis not present

## 2020-10-01 DIAGNOSIS — R29704 NIHSS score 4: Secondary | ICD-10-CM | POA: Diagnosis not present

## 2020-10-01 DIAGNOSIS — R030 Elevated blood-pressure reading, without diagnosis of hypertension: Secondary | ICD-10-CM | POA: Diagnosis not present

## 2020-10-01 DIAGNOSIS — R29810 Facial weakness: Secondary | ICD-10-CM | POA: Diagnosis not present

## 2020-10-01 LAB — ECHOCARDIOGRAM COMPLETE
Height: 61 in
S' Lateral: 2.54 cm
Weight: 2459.2 oz

## 2020-10-01 MED ORDER — ATORVASTATIN CALCIUM 40 MG PO TABS
40.0000 mg | ORAL_TABLET | Freq: Every day | ORAL | Status: DC
  Administered 2020-10-01: 18:00:00 40 mg via ORAL
  Filled 2020-10-01: qty 1

## 2020-10-01 MED ORDER — CLOPIDOGREL BISULFATE 75 MG PO TABS
75.0000 mg | ORAL_TABLET | Freq: Every day | ORAL | Status: DC
  Administered 2020-10-01: 18:00:00 75 mg via ORAL
  Filled 2020-10-01: qty 1

## 2020-10-01 MED ORDER — ASPIRIN EC 325 MG PO TBEC
325.0000 mg | DELAYED_RELEASE_TABLET | Freq: Every day | ORAL | Status: DC
  Administered 2020-10-01: 18:00:00 325 mg via ORAL
  Filled 2020-10-01: qty 1

## 2020-10-01 MED ORDER — CLOPIDOGREL BISULFATE 75 MG PO TABS: 75.0000 mg | ORAL_TABLET | Freq: Every day | ORAL | 2 refills | Status: DC

## 2020-10-01 MED ORDER — ASPIRIN 325 MG PO TBEC: 325.0000 mg | DELAYED_RELEASE_TABLET | Freq: Every day | ORAL | 11 refills | Status: DC

## 2020-10-01 MED ORDER — ATORVASTATIN CALCIUM 40 MG PO TABS: 40.0000 mg | ORAL_TABLET | Freq: Every day | ORAL | 2 refills | Status: DC

## 2020-10-01 NOTE — Discharge Summary (Signed)
Physician Discharge Summary  Kelly [name] RUE:[id number] DOB: [birth year] DOA: 09/29/2020  PCP: Patient, No Pcp Per  Admit date: 09/29/2020 Discharge date: 10/01/2020  Admitted From: Home Disposition: Home with home health  Recommendations for Outpatient Follow-up:  1. Follow up with PCP in 1-2 weeks 2. Cardiology will call and schedule follow-up to place loop recorder 3. We will send referral to neurology for outpatient follow-up.  Home Health: PT/OT/speech Equipment/Devices: None needed  Discharge Condition: Stable CODE STATUS: Full code Diet recommendation: Low-salt diet, low-cholesterol diet  Discharge summary: 86 year old female with medical history of GERD but no other medical issues presented to the ER with 5 days of right-sided weakness. Patient speaks [place] her family speaks [place]. Apparently patient had flulike symptoms and was taking Z-Pak. Patient's children noticed that she was having hard time mobilizing for the last 5 days. In the emergency department, afebrile, stable blood pressures. CTA of the head and neck revealed occlusion of the left MCA M1 segment. She was found to have acute left MCA stroke and admitted to the hospital.  Acute ischemic stroke left basal ganglia and left corona radiata: Presented with symptomatology onset about 5 days. Clinical findings, right facial droop and right upper extremity weakness. CTA of the head and neck, occlusion of left MCA M1 segment proximally with good collateralization. 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 MRI of the brain, acute/subacute infarcts left basal ganglia and corona radiata. 2D echocardiogram, normal ejection fraction. No source of emboli. Antiplatelet therapy, none at home. Aspirin 325 mg daily to continue, Plavix 75 mg daily for 90 days as recommended by neurology. LDL, 180. Goal less than 70. Never been on statin. Started on Lipitor 40 mg daily. Hemoglobin A1c, 6. No indication for  treatment. Seen by therapies, recommended acute inpatient rehab however patient and family wanted to go home and do prescribed home health therapies.  Discharging home with home health PT/OT/speech as requested by patient and family. Neurology follow-up. Unable to place loop recorder today, EP cardiology will follow up as outpatient for loop recorder placement for event monitoring.  Clinically stable for discharge.     Discharge Diagnoses:  Principal Problem:   CVA (cerebral vascular accident) (HCC) Active Problems:   GERD (gastroesophageal reflux disease)   Prediabetes   Elevated blood pressure reading   Acute ischemic left MCA stroke [facility])    Discharge Instructions  Discharge Instructions    Ambulatory referral to Neurology   Complete by: As directed    An appointment is requested in approximately: 4 weeks   Diet - low sodium heart healthy   Complete by: As directed    Increase activity slowly   Complete by: As directed      Allergies as of 10/01/2020   No Known Allergies     Medication List    STOP taking these medications   azithromycin 250 MG tablet Commonly known as: ZITHROMAX     TAKE these medications   aspirin 325 MG EC tablet Take 1 tablet (325 mg total) by mouth daily.   atorvastatin 40 MG tablet Commonly known as: LIPITOR Take 1 tablet (40 mg total) by mouth daily.   clopidogrel 75 MG tablet Commonly known as: PLAVIX Take 1 tablet (75 mg total) by mouth daily.   pantoprazole 40 MG tablet Commonly known as: PROTONIX TAKE ONE TABLET TWICE DAILY 30 MINUTES BEFORE AM/PM MEALS       Follow-up Information    [facility] Follow up.   Specialty: Home Health  Services Why: The home health agency will contact you for the first home visit. Contact information: [address] [place] [address] [phone]              No Known Allergies  Consultations:  Neurology   Procedures/Studies: CT Angio Head W or  Wo Contrast  Result Date: 09/29/2020 CLINICAL DATA:  Right-sided weakness EXAM: CT ANGIOGRAPHY HEAD AND NECK TECHNIQUE: Multidetector CT imaging of the head and neck was performed using the standard protocol during bolus administration of intravenous contrast. Multiplanar CT image reconstructions and MIPs were obtained to evaluate the vascular anatomy. Carotid stenosis measurements (when applicable) are obtained utilizing NASCET criteria, using the distal internal carotid diameter as the denominator. CONTRAST:  OMNIPAQUE IOHEXOL 350 MG/ML SOLN COMPARISON:  None. FINDINGS: CTA NECK FINDINGS SKELETON: There is no bony spinal canal stenosis. No lytic or blastic lesion. OTHER NECK: Normal pharynx, larynx and major salivary glands. No cervical lymphadenopathy. Unremarkable thyroid gland. UPPER CHEST: No pneumothorax or pleural effusion. No nodules or masses. AORTIC ARCH: There is calcific atherosclerosis of the aortic arch. There is no aneurysm, dissection or hemodynamically significant stenosis of the visualized portion of the aorta. Conventional 3 vessel aortic branching pattern. The visualized proximal subclavian arteries are widely patent. RIGHT CAROTID SYSTEM: Normal without aneurysm, dissection or stenosis. LEFT CAROTID SYSTEM: Normal without aneurysm, dissection or stenosis. VERTEBRAL ARTERIES: Left dominant configuration. Both origins are clearly patent. There is no dissection, occlusion or flow-limiting stenosis to the skull base (V1-V3 segments). CTA HEAD FINDINGS POSTERIOR CIRCULATION: --Vertebral arteries: Normal V4 segments. --Inferior cerebellar arteries: Normal. 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 --Superior cerebellar arteries: Normal. --Posterior cerebral arteries (PCA): Moderate left P1 P2 junction stenosis. Normal right PCA. ANTERIOR CIRCULATION: --Intracranial internal carotid arteries: Normal. --Anterior cerebral arteries (ACA): Normal. Both A1  segments are present. Patent anterior communicating artery (a-comm). --Middle cerebral arteries (MCA): There is occlusion of the left M1 segment proximally. There is good collateral flow with patency of most M2 branches. The right MCA is mildly narrowed at the M1 M2 junction. VENOUS SINUSES: As permitted by contrast timing, patent. ANATOMIC VARIANTS: None Review of the MIP images confirms the above findings. IMPRESSION: 1. 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 Critical Value/emergent results were called by telephone at the time of interpretation on 09/29/2020 at 7:16 pm to provider DAVID YAO , who verbally acknowledged these results. 2. 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 3. Moderate left PCA P1 P2 junction stenosis. Aortic Atherosclerosis (ICD10-I70.0). Electronically Signed   By: Deatra Robinson M.D.   On: 09/29/2020 19:17   DG Chest 2 View  Result Date: 09/29/2020 CLINICAL DATA:  Weakness EXAM: CHEST - 2 VIEW COMPARISON:  12/03/2012 FINDINGS: Streaky atelectasis or scar at the lingula. No focal consolidation or effusion. Stable cardiomediastinal silhouette. Aortic atherosclerosis. No pneumothorax. IMPRESSION: Streaky atelectasis or scar at the lingula. Electronically Signed   By: Jasmine Pang M.D.   On: 09/29/2020 19:11   CT Angio Neck W and/or Wo Contrast  Result Date: 09/29/2020 CLINICAL DATA:  Right-sided weakness EXAM: CT ANGIOGRAPHY HEAD AND NECK TECHNIQUE: Multidetector CT imaging of the head and neck was performed using the standard protocol during bolus administration of intravenous contrast. Multiplanar CT image reconstructions and MIPs were obtained to evaluate the vascular anatomy. Carotid stenosis measurements (when applicable) are obtained utilizing NASCET criteria, using the distal internal carotid diameter as the denominator. CONTRAST:  OMNIPAQUE  IOHEXOL 350 MG/ML SOLN COMPARISON:  None. FINDINGS: CTA NECK FINDINGS  SKELETON: There is no bony spinal canal stenosis. No lytic or blastic lesion. OTHER NECK: Normal pharynx, larynx and major salivary glands. No cervical lymphadenopathy. Unremarkable thyroid gland. UPPER CHEST: No pneumothorax or pleural effusion. No nodules or masses. AORTIC ARCH: There is calcific atherosclerosis of the aortic arch. There is no aneurysm, dissection or hemodynamically significant stenosis of the visualized portion of the aorta. Conventional 3 vessel aortic branching pattern. The visualized proximal subclavian arteries are widely patent. RIGHT CAROTID SYSTEM: Normal without aneurysm, dissection or stenosis. LEFT CAROTID SYSTEM: Normal without aneurysm, dissection or stenosis. VERTEBRAL ARTERIES: Left dominant configuration. Both origins are clearly patent. There is no dissection, occlusion or flow-limiting stenosis to the skull base (V1-V3 segments). CTA HEAD FINDINGS POSTERIOR CIRCULATION: --Vertebral arteries: Normal V4 segments. --Inferior cerebellar arteries: Normal. 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 --Superior cerebellar arteries: Normal. --Posterior cerebral arteries (PCA): Moderate left P1 P2 junction stenosis. Normal right PCA. ANTERIOR CIRCULATION: --Intracranial internal carotid arteries: Normal. --Anterior cerebral arteries (ACA): Normal. Both A1 segments are present. Patent anterior communicating artery (a-comm). --Middle cerebral arteries (MCA): There is occlusion of the left M1 segment proximally. There is good collateral flow with patency of most M2 branches. The right MCA is mildly narrowed at the M1 M2 junction. VENOUS SINUSES: As permitted by contrast timing, patent. ANATOMIC VARIANTS: None Review of the MIP images confirms the above findings. IMPRESSION: 1. 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 Critical Value/emergent results were called by telephone at the time of interpretation on 09/29/2020 at 7:16  pm to provider DAVID YAO , who verbally acknowledged these results. 2. 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 3. Moderate left PCA P1 P2 junction stenosis. Aortic Atherosclerosis (ICD10-I70.0). Electronically Signed   By: Deatra RobinsonKevin  Herman M.D.   On: 09/29/2020 19:17   MR ANGIO HEAD WO CONTRAST  Result Date: 09/30/2020 CLINICAL DATA:  Stroke follow-up EXAM: MRI HEAD WITHOUT CONTRAST MRA HEAD WITHOUT CONTRAST TECHNIQUE: Multiplanar, multiecho pulse sequences of the brain and surrounding structures were obtained without intravenous contrast. Angiographic images of the head were obtained using MRA technique without contrast. COMPARISON:  CT angiogram of the head and neck September 29, 2020. FINDINGS: MRI HEAD FINDINGS The study is degraded by motion. Brain: Area of restricted diffusion is seen involving the basal ganglia and corona radiata on the left with additional focus of restricted in the left precentral gyrus. Scattered foci of T2 hyperintensity are seen within the white matter of the cerebral hemispheres, nonspecific, most likely related to chronic microangiopathic changes. No intracranial hemorrhage, hydrocephalus, extra-axial collection or mass lesion. Vascular: Abnormal flow void in the left M1 segment. Skull and upper cervical spine: Normal marrow signal. Sinuses/Orbits: Mucous retention cyst in the right maxillary sinus. The orbits are maintained. MRA HEAD FINDINGS Normal flow related enhancement in the upper cervical and intracranial right internal carotid artery with moderate stenosis at the terminus. Normal caliber and flow related enhancement of the upper cervical and intracranial left internal carotid artery. Occlusion of the proximal left M1/MCA, as seen on prior CT angiogram. Short segment occlusion of the proximal right M2/MCA posterior division branch. Dominant left vertebral artery with normal caliber and flow related enhancement. Hypoplastic right vertebral artery has severe  stenosis at the vertebrobasilar junction. Mild luminal irregularity of the mid basilar artery may be related to intracranial atherosclerotic disease without hemodynamically significant stenosis. Luminal irregularity of the  bilateral posterior cerebral arteries, consistent with intracranial atherosclerotic disease with moderate stenosis at the left P1-P2/PCA junction. IMPRESSION: 1. Acute/subacute infarcts involving the left basal ganglia and corona radiata with additional focus of restricted diffusion in the left precentral gyrus. 2. Occlusion of the proximal left M1/MCA, as seen on prior CT angiogram. 3. Short segment occlusion of the proximal right M2/MCA posterior division branch. 4. Luminal irregularity of the bilateral posterior cerebral arteries, consistent with intracranial atherosclerotic disease with moderate stenosis at the left P1-P2/PCA junction. 5. Moderate stenosis at the terminus of the right internal carotid artery. Electronically Signed   By: Baldemar Lenis M.D.   On: 09/30/2020 20:20   MR BRAIN WO CONTRAST  Result Date: 09/30/2020 CLINICAL DATA:  Stroke follow-up EXAM: MRI HEAD WITHOUT CONTRAST MRA HEAD WITHOUT CONTRAST TECHNIQUE: Multiplanar, multiecho pulse sequences of the brain and surrounding structures were obtained without intravenous contrast. Angiographic images of the head were obtained using MRA technique without contrast. COMPARISON:  CT angiogram of the head and neck September 29, 2020. FINDINGS: MRI HEAD FINDINGS The study is degraded by motion. Brain: Area of restricted diffusion is seen involving the basal ganglia and corona radiata on the left with additional focus of restricted in the left precentral gyrus. Scattered foci of T2 hyperintensity are seen within the white matter of the cerebral hemispheres, nonspecific, most likely related to chronic microangiopathic changes. No intracranial hemorrhage, hydrocephalus, extra-axial collection or mass lesion. Vascular:  Abnormal flow void in the left M1 segment. Skull and upper cervical spine: Normal marrow signal. Sinuses/Orbits: Mucous retention cyst in the right maxillary sinus. The orbits are maintained. MRA HEAD FINDINGS Normal flow related enhancement in the upper cervical and intracranial right internal carotid artery with moderate stenosis at the terminus. Normal caliber and flow related enhancement of the upper cervical and intracranial left internal carotid artery. Occlusion of the proximal left M1/MCA, as seen on prior CT angiogram. Short segment occlusion of the proximal right M2/MCA posterior division branch. Dominant left vertebral artery with normal caliber and flow related enhancement. Hypoplastic right vertebral artery has severe stenosis at the vertebrobasilar junction. Mild luminal irregularity of the mid basilar artery may be related to intracranial atherosclerotic disease without hemodynamically significant stenosis. Luminal irregularity of the bilateral posterior cerebral arteries, consistent with intracranial atherosclerotic disease with moderate stenosis at the left P1-P2/PCA junction. IMPRESSION: 1. Acute/subacute infarcts involving the left basal ganglia and corona radiata with additional focus of restricted diffusion in the left precentral gyrus. 2. Occlusion of the proximal left M1/MCA, as seen on prior CT angiogram. 3. Short segment occlusion of the proximal right M2/MCA posterior division branch. 4. Luminal irregularity of the bilateral posterior cerebral arteries, consistent with intracranial atherosclerotic disease with moderate stenosis at the left P1-P2/PCA junction. 5. Moderate stenosis at the terminus of the right internal carotid artery. Electronically Signed   By: Baldemar Lenis M.D.   On: 09/30/2020 20:20   ECHOCARDIOGRAM COMPLETE  Result Date: 10/01/2020    ECHOCARDIOGRAM REPORT   Patient Name:   KASIAH MANKA Date of Exam: 10/01/2020 Medical Rec #:  [id number]  Height:        61.0 in Accession #:    [id number] Weight:       153.7 lb Date of Birth:  [birth year]  BSA:          1.689 m Patient Age:    86 years   BP:           135/73 mmHg Patient Gender: F  HR:           92 bpm. Exam Location:  Inpatient Procedure: 2D Echo, Color Doppler and Cardiac Doppler Indications:    Stroke i163.9  History:        Patient has no prior history of Echocardiogram examinations.  Sonographer:    Irving Burton Senior RDCS Referring Phys: [phone] RONDELL A SMITH  Sonographer Comments: Technically difficult due to small rib spacing. IMPRESSIONS  1. Left ventricular ejection fraction, by estimation, is 60 to 65%. The left ventricle has normal function. The left ventricle has no regional wall motion abnormalities. Left ventricular diastolic parameters are indeterminate.  2. Right ventricular systolic function is normal. The right ventricular size is normal. There is normal pulmonary artery systolic pressure.  3. The mitral valve is normal in structure. No evidence of mitral valve regurgitation. No evidence of mitral stenosis.  4. The aortic valve is normal in structure. Aortic valve regurgitation is not visualized. No aortic stenosis is present.  5. The inferior vena cava is normal in size with greater than 50% respiratory variability, suggesting right atrial pressure of 3 mmHg. Conclusion(s)/Recommendation(s): No intracardiac source of embolism detected on this transthoracic study. A transesophageal echocardiogram is recommended to exclude cardiac source of embolism if clinically indicated. FINDINGS  Left Ventricle: Left ventricular ejection fraction, by estimation, is 60 to 65%. The left ventricle has normal function. The left ventricle has no regional wall motion abnormalities. The left ventricular internal cavity size was normal in size. There is  no left ventricular hypertrophy. Left ventricular diastolic parameters are indeterminate. Right Ventricle: The right ventricular size is normal. No increase in  right ventricular wall thickness. Right ventricular systolic function is normal. There is normal pulmonary artery systolic pressure. The tricuspid regurgitant velocity is 2.33 m/s, and  with an assumed right atrial pressure of 3 mmHg, the estimated right ventricular systolic pressure is 24.7 mmHg. Left Atrium: Left atrial size was normal in size. Right Atrium: Right atrial size was normal in size. Pericardium: There is no evidence of pericardial effusion. Mitral Valve: The mitral valve is normal in structure. No evidence of mitral valve regurgitation. No evidence of mitral valve stenosis. Tricuspid Valve: The tricuspid valve is normal in structure. Tricuspid valve regurgitation is not demonstrated. No evidence of tricuspid stenosis. Aortic Valve: The aortic valve is normal in structure. Aortic valve regurgitation is not visualized. No aortic stenosis is present. Pulmonic Valve: The pulmonic valve was normal in structure. Pulmonic valve regurgitation is trivial. No evidence of pulmonic stenosis. Aorta: The aortic root is normal in size and structure. Venous: The inferior vena cava is normal in size with greater than 50% respiratory variability, suggesting right atrial pressure of 3 mmHg. IAS/Shunts: No atrial level shunt detected by color flow Doppler.  LEFT VENTRICLE PLAX 2D LVIDd:         4.14 cm LVIDs:         2.54 cm LV PW:         0.81 cm LV IVS:        0.65 cm LVOT diam:     1.80 cm LV SV:         33 LV SV Index:   19 LVOT Area:     2.54 cm  RIGHT VENTRICLE RV S prime:     9.90 cm/s TAPSE (M-mode): 1.8 cm LEFT ATRIUM           Index      RIGHT ATRIUM  Index LA diam:      2.30 cm 1.36 cm/m RA Area:     12.10 cm LA Vol (A2C): 11.5 ml 6.81 ml/m RA Volume:   27.20 ml  16.11 ml/m LA Vol (A4C): 14.8 ml 8.76 ml/m  AORTIC VALVE LVOT Vmax:   79.10 cm/s LVOT Vmean:  55.600 cm/s LVOT VTI:    0.129 m  AORTA Ao Root diam: 3.30 cm Ao Asc diam:  3.20 cm TRICUSPID VALVE TR Peak grad:   21.7 mmHg TR Vmax:         233.00 cm/s  SHUNTS Systemic VTI:  0.13 m Systemic Diam: 1.80 cm Donato Schultz MD Electronically signed by Donato Schultz MD Signature Date/Time: 10/01/2020/11:01:52 AM    Final    VAS [place] LOWER EXTREMITY VENOUS (DVT)  Result Date: 10/01/2020  Lower Venous DVT Study Indications: Stroke.  Comparison Study: No previous exam Performing Technologist: Clint Guy RVT  Examination Guidelines: A complete evaluation includes B-mode imaging, spectral Doppler, color Doppler, and power Doppler as needed of all accessible portions of each vessel. Bilateral testing is considered an integral part of a complete examination. Limited examinations for reoccurring indications may be performed as noted. The reflux portion of the exam is performed with the patient in reverse Trendelenburg.  +---------+---------------+---------+-----------+----------+--------------+ RIGHT    CompressibilityPhasicitySpontaneityPropertiesThrombus Aging +---------+---------------+---------+-----------+----------+--------------+ CFV      Full           Yes      Yes                                 +---------+---------------+---------+-----------+----------+--------------+ SFJ      Full                                                        +---------+---------------+---------+-----------+----------+--------------+ FV Prox  Full                                                        +---------+---------------+---------+-----------+----------+--------------+ FV Mid   Full                                                        +---------+---------------+---------+-----------+----------+--------------+ FV DistalFull                                                        +---------+---------------+---------+-----------+----------+--------------+ PFV      Full                                                        +---------+---------------+---------+-----------+----------+--------------+ POP      Full  Yes      Yes                                 +---------+---------------+---------+-----------+----------+--------------+ PTV      Full                                                        +---------+---------------+---------+-----------+----------+--------------+ PERO     Full                                                        +---------+---------------+---------+-----------+----------+--------------+   +---------+---------------+---------+-----------+----------+--------------+ LEFT     CompressibilityPhasicitySpontaneityPropertiesThrombus Aging +---------+---------------+---------+-----------+----------+--------------+ CFV      Full           Yes      Yes                                 +---------+---------------+---------+-----------+----------+--------------+ SFJ      Full                                                        +---------+---------------+---------+-----------+----------+--------------+ FV Prox  Full                                                        +---------+---------------+---------+-----------+----------+--------------+ FV Mid   Full                                                        +---------+---------------+---------+-----------+----------+--------------+ FV DistalFull                                                        +---------+---------------+---------+-----------+----------+--------------+ PFV      Full                                                        +---------+---------------+---------+-----------+----------+--------------+ POP      Full           Yes      Yes                                 +---------+---------------+---------+-----------+----------+--------------+ PTV  Full                                                        +---------+---------------+---------+-----------+----------+--------------+ PERO     Full                                                         +---------+---------------+---------+-----------+----------+--------------+     Summary: RIGHT: - There is no evidence of deep vein thrombosis in the lower extremity.  - No cystic structure found in the popliteal fossa.  LEFT: - There is no evidence of deep vein thrombosis in the lower extremity.  - No cystic structure found in the popliteal fossa.  *See table(s) above for measurements and observations.    Preliminary    (Echo, Carotid, EGD, Colonoscopy, ERCP)    Subjective: Patient seen and examined. Son was at the bedside who acted as interpreter in patient's request. Patient was following commands. She waved her hands. She ate without difficulty. We discussed about need for rehab and patient states she would like to go home and do rehab at home. She has adequate support at home.   Discharge Exam: Vitals:   10/01/20 1204 10/01/20 1558  BP: 138/67 (!) 143/73  Pulse: 65 64  Resp: 16 18  Temp: 98.6 F (37 C) 98.8 F (37.1 C)  SpO2: 97% 97%   Vitals:   10/01/20 0418 10/01/20 0727 10/01/20 1204 10/01/20 1558  BP: (!) 150/84 135/73 138/67 (!) 143/73  Pulse: 80 75 65 64  Resp: 16 16 16 18   Temp: 98.1 F (36.7 C) 98.7 F (37.1 C) 98.6 F (37 C) 98.8 F (37.1 C)  TempSrc: Oral Oral Oral Oral  SpO2: 99% 97% 97% 97%  Weight:      Height:        General: Pt is alert, awake, not in acute distress Looks comfortable. Does not initiate communication probably due to language barrier. Cardiovascular: RRR, S1/S2 +, no rubs, no gallops Respiratory: CTA bilaterally, no wheezing, no rhonchi Abdominal: Soft, NT, ND, bowel sounds + Extremities: no edema, no cyanosis Neuro exam: Ptosis right eye, right facial droop Right upper extremity distal 3/5, proximal 4/5 Lower extremities bilateral equal.    The results of significant diagnostics from this hospitalization (including imaging, microbiology, ancillary and laboratory) are listed below for reference.     Microbiology: Recent Results  (from the past 240 hour(s))  Resp Panel by RT-PCR (Flu A&B, Covid) Nasopharyngeal Swab     Status: None   Collection Time: 09/29/20  5:51 PM   Specimen: Nasopharyngeal Swab; Nasopharyngeal(NP) swabs in vial transport medium  Result Value Ref Range Status   SARS Coronavirus 2 by RT PCR NEGATIVE NEGATIVE Final    Comment: (NOTE) SARS-CoV-2 target nucleic acids are NOT DETECTED.  The SARS-CoV-2 RNA is generally detectable in upper respiratory specimens during the acute phase of infection. The lowest concentration of SARS-CoV-2 viral copies this assay can detect is 138 copies/mL. A negative result does not preclude SARS-Cov-2 infection and should not be used as the sole basis for treatment or other patient management decisions. A negative result may occur with  improper specimen collection/handling, submission of  specimen other than nasopharyngeal swab, presence of viral mutation(s) within the areas targeted by this assay, and inadequate number of viral copies(<138 copies/mL). A negative result must be combined with clinical observations, patient history, and epidemiological information. The expected result is Negative.  Fact Sheet for Patients:  BloggerCourse.com  Fact Sheet for Healthcare Providers:  SeriousBroker.it  This test is no t yet approved or cleared by the [place] FDA and  has been authorized for detection and/or diagnosis of SARS-CoV-2 by FDA under an Emergency Use Authorization (EUA). This EUA will remain  in effect (meaning this test can be used) for the duration of the COVID-19 declaration under Section 564(b)(1) of the Act, 21 U.S.C.section 360bbb-3(b)(1), unless the authorization is terminated  or revoked sooner.       Influenza A by PCR NEGATIVE NEGATIVE Final   Influenza B by PCR NEGATIVE NEGATIVE Final    Comment: (NOTE) The Xpert Xpress SARS-CoV-2/FLU/RSV plus assay is intended as an aid in the  diagnosis of influenza from Nasopharyngeal swab specimens and should not be used as a sole basis for treatment. Nasal washings and aspirates are unacceptable for Xpert Xpress SARS-CoV-2/FLU/RSV testing.  Fact Sheet for Patients: BloggerCourse.com  Fact Sheet for Healthcare Providers: SeriousBroker.it  This test is not yet approved or cleared by the [place] FDA and has been authorized for detection and/or diagnosis of SARS-CoV-2 by FDA under an Emergency Use Authorization (EUA). This EUA will remain in effect (meaning this test can be used) for the duration of the COVID-19 declaration under Section 564(b)(1) of the Act, 21 U.S.C. section 360bbb-3(b)(1), unless the authorization is terminated or revoked.  Performed at [facility], [address] [place], [place] [address]      Labs: BNP (last 3 results) No results for input(s): BNP in the last 8760 hours. Basic Metabolic Panel: Recent Labs  Lab 09/29/20 1751  NA 139  K 3.8  CL 105  CO2 26  GLUCOSE 127*  BUN 11  CREATININE 0.81  CALCIUM 8.7*   Liver Function Tests: Recent Labs  Lab 09/29/20 1751  AST 23  ALT 16  ALKPHOS 65  BILITOT 0.5  PROT 7.2  ALBUMIN 4.1   No results for input(s): LIPASE, AMYLASE in the last 168 hours. No results for input(s): AMMONIA in the last 168 hours. CBC: Recent Labs  Lab 09/29/20 1751  WBC 6.2  NEUTROABS 3.6  HGB 13.2  HCT 39.2  MCV 87.1  PLT 210   Cardiac Enzymes: No results for input(s): CKTOTAL, CKMB, CKMBINDEX, TROPONINI in the last 168 hours. BNP: Invalid input(s): POCBNP CBG: No results for input(s): GLUCAP in the last 168 hours. D-Dimer No results for input(s): DDIMER in the last 72 hours. Hgb A1c Recent Labs    09/30/20 1659  HGBA1C 6.0*   Lipid Profile Recent Labs    09/30/20 1659  CHOL 254*  HDL 41  LDLCALC 180*  TRIG 167*  CHOLHDL 6.2   Thyroid function studies No results for  input(s): TSH, T4TOTAL, T3FREE, THYROIDAB in the last 72 hours.  Invalid input(s): FREET3 Anemia work up No results for input(s): VITAMINB12, FOLATE, FERRITIN, TIBC, IRON, RETICCTPCT in the last 72 hours. Urinalysis    Component Value Date/Time   COLORURINE YELLOW 09/29/2020 2215   APPEARANCEUR CLEAR 09/29/2020 2215   LABSPEC 1.005 09/29/2020 2215   PHURINE 8.0 09/29/2020 2215   GLUCOSEU NEGATIVE 09/29/2020 2215   HGBUR NEGATIVE 09/29/2020 2215   BILIRUBINUR NEGATIVE 09/29/2020 2215   KETONESUR NEGATIVE  09/29/2020 2215   PROTEINUR NEGATIVE 09/29/2020 2215   NITRITE NEGATIVE 09/29/2020 2215   LEUKOCYTESUR NEGATIVE 09/29/2020 2215   Sepsis Labs Invalid input(s): PROCALCITONIN,  WBC,  LACTICIDVEN Microbiology Recent Results (from the past 240 hour(s))  Resp Panel by RT-PCR (Flu A&B, Covid) Nasopharyngeal Swab     Status: None   Collection Time: 09/29/20  5:51 PM   Specimen: Nasopharyngeal Swab; Nasopharyngeal(NP) swabs in vial transport medium  Result Value Ref Range Status   SARS Coronavirus 2 by RT PCR NEGATIVE NEGATIVE Final    Comment: (NOTE) SARS-CoV-2 target nucleic acids are NOT DETECTED.  The SARS-CoV-2 RNA is generally detectable in upper respiratory specimens during the acute phase of infection. The lowest concentration of SARS-CoV-2 viral copies this assay can detect is 138 copies/mL. A negative result does not preclude SARS-Cov-2 infection and should not be used as the sole basis for treatment or other patient management decisions. A negative result may occur with  improper specimen collection/handling, submission of specimen other than nasopharyngeal swab, presence of viral mutation(s) within the areas targeted by this assay, and inadequate number of viral copies(<138 copies/mL). A negative result must be combined with clinical observations, patient history, and epidemiological information. The expected result is Negative.  Fact Sheet for Patients:   BloggerCourse.com  Fact Sheet for Healthcare Providers:  SeriousBroker.it  This test is no t yet approved or cleared by the [place] FDA and  has been authorized for detection and/or diagnosis of SARS-CoV-2 by FDA under an Emergency Use Authorization (EUA). This EUA will remain  in effect (meaning this test can be used) for the duration of the COVID-19 declaration under Section 564(b)(1) of the Act, 21 U.S.C.section 360bbb-3(b)(1), unless the authorization is terminated  or revoked sooner.       Influenza A by PCR NEGATIVE NEGATIVE Final   Influenza B by PCR NEGATIVE NEGATIVE Final    Comment: (NOTE) The Xpert Xpress SARS-CoV-2/FLU/RSV plus assay is intended as an aid in the diagnosis of influenza from Nasopharyngeal swab specimens and should not be used as a sole basis for treatment. Nasal washings and aspirates are unacceptable for Xpert Xpress SARS-CoV-2/FLU/RSV testing.  Fact Sheet for Patients: BloggerCourse.com  Fact Sheet for Healthcare Providers: SeriousBroker.it  This test is not yet approved or cleared by the [place] FDA and has been authorized for detection and/or diagnosis of SARS-CoV-2 by FDA under an Emergency Use Authorization (EUA). This EUA will remain in effect (meaning this test can be used) for the duration of the COVID-19 declaration under Section 564(b)(1) of the Act, 21 U.S.C. section 360bbb-3(b)(1), unless the authorization is terminated or revoked.  Performed at [facility], [address], [place], [place] [address]      Time coordinating discharge: 40 minutes  SIGNED:   Dorcas Carrow, MD  [facility] 10/01/2020, 4:53 PM

## 2020-10-01 NOTE — Progress Notes (Addendum)
STROKE TEAM PROGRESS NOTE   INTERVAL HISTORY Her son was at bedside who helped interpret.  Patient in chair. NAD.  Vitals:   10/01/20 0004 10/01/20 0418 10/01/20 0727 10/01/20 1204  BP: (!) 163/89 (!) 150/84 135/73 138/67  Pulse: 92 80 75 65  Resp: 16 16 16 16   Temp: 98.1 F (36.7 C) 98.1 F (36.7 C) 98.7 F (37.1 C) 98.6 F (37 C)  TempSrc: Oral Oral Oral Oral  SpO2: 97% 99% 97% 97%  Weight:      Height:       CBC:  Recent Labs  Lab 09/29/20 1751  WBC 6.2  NEUTROABS 3.6  HGB 13.2  HCT 39.2  MCV 87.1  PLT 210   Basic Metabolic Panel:  Recent Labs  Lab 09/29/20 1751  NA 139  K 3.8  CL 105  CO2 26  GLUCOSE 127*  BUN 11  CREATININE 0.81  CALCIUM 8.7*   Lipid Panel:  Recent Labs  Lab 09/30/20 1659  CHOL 254*  TRIG 167*  HDL 41  CHOLHDL 6.2  VLDL 33  LDLCALC 10/02/20*   HgbA1c:  Recent Labs  Lab 09/30/20 1659  HGBA1C 6.0*   Urine Drug Screen:  Recent Labs  Lab 09/29/20 2215  LABOPIA NONE DETECTED  COCAINSCRNUR NONE DETECTED  LABBENZ NONE DETECTED  AMPHETMU NONE DETECTED  THCU NONE DETECTED  LABBARB NONE DETECTED    Alcohol Level  Recent Labs  Lab 09/29/20 1751  ETH <10    IMAGING past 24 hours MR ANGIO HEAD WO CONTRAST  Result Date: 09/30/2020 CLINICAL DATA:  Stroke follow-up EXAM: MRI HEAD WITHOUT CONTRAST MRA HEAD WITHOUT CONTRAST TECHNIQUE: Multiplanar, multiecho pulse sequences of the brain and surrounding structures were obtained without intravenous contrast. Angiographic images of the head were obtained using MRA technique without contrast. COMPARISON:  CT angiogram of the head and neck September 29, 2020. FINDINGS: MRI HEAD FINDINGS The study is degraded by motion. Brain: Area of restricted diffusion is seen involving the basal ganglia and corona radiata on the left with additional focus of restricted in the left precentral gyrus. Scattered foci of T2 hyperintensity are seen within the white matter of the cerebral hemispheres,  nonspecific, most likely related to chronic microangiopathic changes. No intracranial hemorrhage, hydrocephalus, extra-axial collection or mass lesion. Vascular: Abnormal flow void in the left M1 segment. Skull and upper cervical spine: Normal marrow signal. Sinuses/Orbits: Mucous retention cyst in the right maxillary sinus. The orbits are maintained. MRA HEAD FINDINGS Normal flow related enhancement in the upper cervical and intracranial right internal carotid artery with moderate stenosis at the terminus. Normal caliber and flow related enhancement of the upper cervical and intracranial left internal carotid artery. Occlusion of the proximal left M1/MCA, as seen on prior CT angiogram. Short segment occlusion of the proximal right M2/MCA posterior division branch. Dominant left vertebral artery with normal caliber and flow related enhancement. Hypoplastic right vertebral artery has severe stenosis at the vertebrobasilar junction. Mild luminal irregularity of the mid basilar artery may be related to intracranial atherosclerotic disease without hemodynamically significant stenosis. Luminal irregularity of the bilateral posterior cerebral arteries, consistent with intracranial atherosclerotic disease with moderate stenosis at the left P1-P2/PCA junction. IMPRESSION: 1. Acute/subacute infarcts involving the left basal ganglia and corona radiata with additional focus of restricted diffusion in the left precentral gyrus. 2. Occlusion of the proximal left M1/MCA, as seen on prior CT angiogram. 3. Short segment occlusion of the proximal right M2/MCA posterior division branch. 4. Luminal irregularity of the bilateral  posterior cerebral arteries, consistent with intracranial atherosclerotic disease with moderate stenosis at the left P1-P2/PCA junction. 5. Moderate stenosis at the terminus of the right internal carotid artery. Electronically Signed   By: Baldemar Lenis M.D.   On: 09/30/2020 20:20   MR BRAIN  WO CONTRAST  Result Date: 09/30/2020 CLINICAL DATA:  Stroke follow-up EXAM: MRI HEAD WITHOUT CONTRAST MRA HEAD WITHOUT CONTRAST TECHNIQUE: Multiplanar, multiecho pulse sequences of the brain and surrounding structures were obtained without intravenous contrast. Angiographic images of the head were obtained using MRA technique without contrast. COMPARISON:  CT angiogram of the head and neck September 29, 2020. FINDINGS: MRI HEAD FINDINGS The study is degraded by motion. Brain: Area of restricted diffusion is seen involving the basal ganglia and corona radiata on the left with additional focus of restricted in the left precentral gyrus. Scattered foci of T2 hyperintensity are seen within the white matter of the cerebral hemispheres, nonspecific, most likely related to chronic microangiopathic changes. No intracranial hemorrhage, hydrocephalus, extra-axial collection or mass lesion. Vascular: Abnormal flow void in the left M1 segment. Skull and upper cervical spine: Normal marrow signal. Sinuses/Orbits: Mucous retention cyst in the right maxillary sinus. The orbits are maintained. MRA HEAD FINDINGS Normal flow related enhancement in the upper cervical and intracranial right internal carotid artery with moderate stenosis at the terminus. Normal caliber and flow related enhancement of the upper cervical and intracranial left internal carotid artery. Occlusion of the proximal left M1/MCA, as seen on prior CT angiogram. Short segment occlusion of the proximal right M2/MCA posterior division branch. Dominant left vertebral artery with normal caliber and flow related enhancement. Hypoplastic right vertebral artery has severe stenosis at the vertebrobasilar junction. Mild luminal irregularity of the mid basilar artery may be related to intracranial atherosclerotic disease without hemodynamically significant stenosis. Luminal irregularity of the bilateral posterior cerebral arteries, consistent with intracranial  atherosclerotic disease with moderate stenosis at the left P1-P2/PCA junction. IMPRESSION: 1. Acute/subacute infarcts involving the left basal ganglia and corona radiata with additional focus of restricted diffusion in the left precentral gyrus. 2. Occlusion of the proximal left M1/MCA, as seen on prior CT angiogram. 3. Short segment occlusion of the proximal right M2/MCA posterior division branch. 4. Luminal irregularity of the bilateral posterior cerebral arteries, consistent with intracranial atherosclerotic disease with moderate stenosis at the left P1-P2/PCA junction. 5. Moderate stenosis at the terminus of the right internal carotid artery. Electronically Signed   By: Baldemar Lenis M.D.   On: 09/30/2020 20:20   ECHOCARDIOGRAM COMPLETE  Result Date: 10/01/2020    ECHOCARDIOGRAM REPORT   Patient Name:   Kelly [name] Date of Exam: 10/01/2020 Medical Rec #:  [id number]  Height:       61.0 in Accession #:    [id number] Weight:       153.7 lb Date of Birth:  10/25/1933  BSA:          1.689 m Patient Age:    86 years   BP:           135/73 mmHg Patient Gender: F          HR:           92 bpm. Exam Location:  Inpatient Procedure: 2D Echo, Color Doppler and Cardiac Doppler Indications:    Stroke i163.9  History:        Patient has no prior history of Echocardiogram examinations.  Sonographer:    Irving Burton Senior RDCS Referring Phys: [id number] RONDELL A SMITH  Sonographer Comments: Technically difficult due to small rib spacing. IMPRESSIONS  1. Left ventricular ejection fraction, by estimation, is 60 to 65%. The left ventricle has normal function. The left ventricle has no regional wall motion abnormalities. Left ventricular diastolic parameters are indeterminate.  2. Right ventricular systolic function is normal. The right ventricular size is normal. There is normal pulmonary artery systolic pressure.  3. The mitral valve is normal in structure. No evidence of mitral valve regurgitation. No evidence of  mitral stenosis.  4. The aortic valve is normal in structure. Aortic valve regurgitation is not visualized. No aortic stenosis is present.  5. The inferior vena cava is normal in size with greater than 50% respiratory variability, suggesting right atrial pressure of 3 mmHg. Conclusion(s)/Recommendation(s): No intracardiac source of embolism detected on this transthoracic study. A transesophageal echocardiogram is recommended to exclude cardiac source of embolism if clinically indicated. FINDINGS  Left Ventricle: Left ventricular ejection fraction, by estimation, is 60 to 65%. The left ventricle has normal function. The left ventricle has no regional wall motion abnormalities. The left ventricular internal cavity size was normal in size. There is  no left ventricular hypertrophy. Left ventricular diastolic parameters are indeterminate. Right Ventricle: The right ventricular size is normal. No increase in right ventricular wall thickness. Right ventricular systolic function is normal. There is normal pulmonary artery systolic pressure. The tricuspid regurgitant velocity is 2.33 m/s, and  with an assumed right atrial pressure of 3 mmHg, the estimated right ventricular systolic pressure is 24.7 mmHg. Left Atrium: Left atrial size was normal in size. Right Atrium: Right atrial size was normal in size. Pericardium: There is no evidence of pericardial effusion. Mitral Valve: The mitral valve is normal in structure. No evidence of mitral valve regurgitation. No evidence of mitral valve stenosis. Tricuspid Valve: The tricuspid valve is normal in structure. Tricuspid valve regurgitation is not demonstrated. No evidence of tricuspid stenosis. Aortic Valve: The aortic valve is normal in structure. Aortic valve regurgitation is not visualized. No aortic stenosis is present. Pulmonic Valve: The pulmonic valve was normal in structure. Pulmonic valve regurgitation is trivial. No evidence of pulmonic stenosis. Aorta: The aortic root  is normal in size and structure. Venous: The inferior vena cava is normal in size with greater than 50% respiratory variability, suggesting right atrial pressure of 3 mmHg. IAS/Shunts: No atrial level shunt detected by color flow Doppler.  LEFT VENTRICLE PLAX 2D LVIDd:         4.14 cm LVIDs:         2.54 cm LV PW:         0.81 cm LV IVS:        0.65 cm LVOT diam:     1.80 cm LV SV:         33 LV SV Index:   19 LVOT Area:     2.54 cm  RIGHT VENTRICLE RV S prime:     9.90 cm/s TAPSE (M-mode): 1.8 cm LEFT ATRIUM           Index      RIGHT ATRIUM           Index LA diam:      2.30 cm 1.36 cm/m RA Area:     12.10 cm LA Vol (A2C): 11.5 ml 6.81 ml/m RA Volume:   27.20 ml  16.11 ml/m LA Vol (A4C): 14.8 ml 8.76 ml/m  AORTIC VALVE LVOT Vmax:   79.10 cm/s LVOT Vmean:  55.600 cm/s LVOT VTI:  0.129 m  AORTA Ao Root diam: 3.30 cm Ao Asc diam:  3.20 cm TRICUSPID VALVE TR Peak grad:   21.7 mmHg TR Vmax:        233.00 cm/s  SHUNTS Systemic VTI:  0.13 m Systemic Diam: 1.80 cm Donato SchultzMark Skains MD Electronically signed by Donato SchultzMark Skains MD Signature Date/Time: 10/01/2020/11:01:52 AM    Final    PHYSICAL EXAM Physical Exam  Constitutional: Appears well-developed and well-nourished.  Psych: Affect appropriate to situation Eyes: Normal external eye and conjunctiva. HENT: Normocephalic, no lesions, without obvious abnormality.   Musculoskeletal-no joint tenderness, deformity or swelling Cardiovascular: Normal rate and regular rhythm.  Respiratory: Effort normal, non-labored breathing saturations WNL GI: Soft.  No distension. There is no tenderness.  Skin: WDI   Neuro:  Mental Status: Alert, oriented to name. Some mild receptive aphasia.  Able to follow most simple commands without difficulty. Cranial Nerves: II: blinks to threat bilaterally  III,IV, VI: ptosis present right eye, extra-ocular motions intact bilaterally pupils equal, round, reactive to light and accommodation V,VII: smile asymmetric, right facial droop,  facial light touch sensation normal bilaterally VIII: hearing normal bilaterally IX,X: uvula rises symmetrically XI: bilateral shoulder shrug XII: midline tongue extension Motor: Right hand grip 3/5    Left hand grip 5/5 Right : Upper extremity   4-/5  Left:     Upper extremity 5 /5  Lower extremity   5/5   Lower extremity   5/5 Tone and bulk:normal tone throughout; no atrophy noted Sensory:  light touch intact throughout, bilaterally Cerebellar: fnf intact on left side, fnf impaired but consistent with level of weakness on right side Gait: deferred   ASSESSMENT/PLAN Ms. Kelly [name] is a 86 y.o. female with history of GERD presenting with 5 day history of right side weakness and facial droop.   Stroke:  Left BG and left corona radiata infarct embolic   CTA head & neck 1. 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 Critical Value/emergent results were called by telephone at the time of interpretation on 09/29/2020 at 7:16 pm to provider DAVID YAO , who verbally acknowledged these results. 2. 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 3. Moderate left PCA P1 P2 junction stenosis.  MRI /MRA  1. Acute/subacute infarcts involving the left basal ganglia and corona radiata with additional focus of restricted diffusion in the left precentral gyrus. 2. Occlusion of the proximal left M1/MCA, as seen on prior CT angiogram. 3. Short segment occlusion of the proximal right M2/MCA posterior division branch. 4. Luminal irregularity of the bilateral posterior cerebral arteries, consistent with intracranial atherosclerotic disease with moderate stenosis at the left P1-P2/PCA junction. 5. Moderate stenosis at the terminus of the right internal carotid Artery.  2D Echo No intracardiac source of embolism  detected on this transthoracic study. A transesophageal echocardiogram is  recommended to exclude cardiac source of embolism if clinically  indicated.   LDL 180  HgbA1c 6.0  VTE prophylaxis - Lovenox    Diet   Diet Heart Room service appropriate? Yes with Assist; Fluid consistency: Thin     No antithrombotic prior to admission, now on aspirin 81 mg daily and clopidogrel 75 mg daily.   Therapy recommendations:  CIR  Disposition:  CIR  Hypertension  Home meds:  none  Stable . Permissive hypertension (OK if < 220/120) but gradually normalize in 5-7 days . Long-term BP goal normotensive  Hyperlipidemia  Home meds:  none, l  LDL 180, goal < 70  Atorvastatin 40 mg daily  Continue statin at discharge  Diabetes type II Controlled  Home meds:  none  HgbA1c 6.0, goal < 7.0  Other Stroke Risk Factors  Advanced Age >/= 49    Hospital day # 0  Valentina Lucks, MSN, NP-C [facility] [phone]  ATTENDING NOTE: I reviewed above note and agree with the assessment and plan. Pt was seen and examined.   86 yo F with PMH of GERD admitted for right sided weakness for 5 days. MRI showed left MCA infarct and CTA head and neck showed left MCA M1 occlusion, as well as mid BA and left P1/P2 moderate stenosis. MRA also showed high grade stenosis of proximal right M2. EF 60-65% and negative for DVT. LDL 180 and A1C 6.0  On exam, pt son served as Engineer, technical sales. Pt drowsy sleepy but easily arousable. Able to answer questions appropriately. Orientated to place, people, age but not to month or year but knows this is at the end of the year. Able to name and repeat, follow simple commands.  Right eye subconjunctival protection, redness, however visual fields are full, able to gaze bilaterally.  Right facial droop, tongue midline.  Right upper extremity mild pronator drift and decreased finger grip.  Left upper extremity normal strength.  However both lower extremity symmetrical strength.  Sensation subjectively symmetrical.  Finger-to-nose intact on the left but slow on the right.  Gait not tested.  Although there  is multifocal intracranial stenosis, patient stroke concerning for cardioembolic source.  Recommend loop recorder placement as outpatient.  Patient son agreed.  Continue aspirin 325 and Plavix 75 DAPT for 3 months given large vessel occlusion.  Continue Lipitor 40 on discharge.  PT/OT recommend home health.  Neurology will sign off. Please call with questions. Pt will follow up with stroke clinic NP at [facility] in about 4 weeks. Thanks for the consult.   Marvel Plan, MD PhD Stroke Neurology 10/01/2020 4:35 PM   To contact Stroke Continuity provider, please refer to [email or web address]. After hours, contact General Neurology

## 2020-10-01 NOTE — Evaluation (Signed)
Occupational Therapy Evaluation Patient Details Name: Kelly [name] MRN: [id number] DOB: 12-17-1933 Today's Date: 10/01/2020    History of Present Illness Patient is an 86 y.o. female presenting with R-sided weakness, slurred speech, and R facial droop x4 days. MIR (+) acute vs. subacute L basal ganglia and corona radiata infarcts. No tPA administered. PMHx significant for GERD.   Clinical Impression   PTA patient was living with her son, DIL, and 2 small grandchildren in a private residence and was independent with ADLs without AD. Family responsible for IADLs including meal prep and transportation. Son present at bedside for interpretation. Patient A&Ox1 with son providing home set-up and PLOF. Patient currently functioning below baseline requiring Min to Mod A grossly for ADLs and ADL transfers with hand held assist. Patient with deficits not limited to R hemiparesis with inability to incorporate RUE into functional tasks, decreased sitting/standing balance presenting increased fall risk, decreased safety awareness, and decreased cognition indicating need for continued acute OT services. Recommendation for CIR placement to maximize safety and independence with self-care tasks and reduce caregiver burden in prep for safe d/c to prior level of living.     Follow Up Recommendations  CIR    Equipment Recommendations  Other (comment) (TBD)    Recommendations for Other Services Rehab consult     Precautions / Restrictions Precautions Precautions: Fall Precaution Comments: R hemiparesis, aphasic Restrictions Weight Bearing Restrictions: No      Mobility Bed Mobility Overal bed mobility: Needs Assistance Bed Mobility: Supine to Sit     Supine to sit: Min assist     General bed mobility comments: Min A at trunk. Patient able to advance BLE from bed surface to EOB without external assist.    Transfers Overall transfer level: Needs assistance Equipment used: 1 person hand held  assist Transfers: Sit to/from [employer] Sit to Stand: Min assist Stand pivot transfers: Min assist       General transfer comment: Min A x2 from EOB positioned in lowest setting with hand held assist. Cues for hand placement.    Balance Overall balance assessment: Needs assistance Sitting-balance support: Single extremity supported;Feet supported Sitting balance-Leahy Scale: Fair Sitting balance - Comments: Patient able to maintain static sitting balance without external assist. Mild LOB when donning footwear.   Standing balance support: Single extremity supported Standing balance-Leahy Scale: Poor Standing balance comment: Reliant on external assist to maintain standing balance.                           ADL either performed or assessed with clinical judgement   ADL Overall ADL's : Needs assistance/impaired     Grooming: Minimal assistance;Sitting           Upper Body Dressing : Minimal assistance;Sitting   Lower Body Dressing: Moderate assistance;Sit to/from stand Lower Body Dressing Details (indicate cue type and reason): Mod A and figure-4 position to don footwear. Unable to functionally use RUE to assist. Toilet Transfer: Minimal assistance Toilet Transfer Details (indicate cue type and reason): Simulated with stand-pivot transfer to recliner and hand held assist. Cues for hand placement. Toileting- Clothing Manipulation and Hygiene: Moderate assistance       Functional mobility during ADLs: Minimal assistance General ADL Comments: Min A and HHA for short-distance mobility in room. Additional assist for line management.     Vision Baseline Vision/History: Wears glasses Wears Glasses: Reading only Patient Visual Report: Other (comment) (Unable to state) Vision Assessment?: Vision impaired- to be  further tested in functional context Additional Comments: Not formally assessed this date 2/2 langauge barrier and expressive difficulties.      Perception     Praxis      Pertinent Vitals/Pain Pain Assessment: No/denies pain     Hand Dominance Right   Extremity/Trunk Assessment Upper Extremity Assessment Upper Extremity Assessment: RUE deficits/detail RUE Deficits / Details: Brunnstrom Stage 3-4. MMT 2-/5 at shoulder, 3-/5 at elbow, and 1/5 at wrist for flexion/extension. RUE Sensation: decreased light touch RUE Coordination: decreased fine motor;decreased gross motor   Lower Extremity Assessment Lower Extremity Assessment: Defer to PT evaluation   Cervical / Trunk Assessment Cervical / Trunk Assessment: Normal   Communication Communication Communication: Other (comment);Prefers language other than English (Difficult to assess 2/2 language barrier. Son states that patient isn't making any sense when responding to questions.)   Cognition Arousal/Alertness: Awake/alert Behavior During Therapy: Flat affect Overall Cognitive Status: Impaired/Different from baseline Area of Impairment: Orientation;Attention;Memory;Following commands;Awareness;Problem solving                 Orientation Level: Disoriented to;Time;Situation;Person Current Attention Level: Sustained Memory: Decreased short-term memory Following Commands: Follows one step commands inconsistently;Follows multi-step commands with increased time   Awareness: Emergent Problem Solving: Slow processing;Requires verbal cues General Comments: Patient oriented to name but not D.O.B. Required increased time and repeat reminders to follow 1-step verbal commands during familiar tasks. Son states that patient isn't making sense when responding to questions.   General Comments       Exercises     Shoulder Instructions      Home Living Family/patient expects to be discharged to:: Private residence Living Arrangements: Children (Son, DIL, and 2 small grandchildren.) Available Help at Discharge: Family;Available 24 hours/day Type of Home: House Home  Access: Stairs to enter [employer] of Steps: 2-3 small steps from garage. Entrance Stairs-Rails: None Home Layout: Two level;Able to live on main level with bedroom/bathroom     Bathroom Shower/Tub: Chief Strategy Officer: Standard     Home Equipment: None          Prior Functioning/Environment Level of Independence: Independent        Comments: I with ADLs/IADLs. Daughter in law responsible for meal prep and homemaking tasks. Family provides transportation.        OT Problem List: Decreased strength;Decreased range of motion;Impaired balance (sitting and/or standing);Decreased coordination;Decreased cognition;Decreased safety awareness;Decreased knowledge of use of DME or AE;Impaired sensation;Impaired UE functional use      OT Treatment/Interventions: Self-care/ADL training;Therapeutic exercise;Neuromuscular education;Energy conservation;DME and/or AE instruction;Therapeutic activities;Cognitive remediation/compensation;Patient/family education;Balance training;Visual/perceptual remediation/compensation    OT Goals(Current goals can be found in the care plan section) Acute Rehab OT Goals Patient Stated Goal: To get stronger OT Goal Formulation: With patient/family Time For Goal Achievement: 10/15/20 Potential to Achieve Goals: Good ADL Goals Pt Will Perform Eating: with modified independence;sitting Pt Will Perform Grooming: with set-up;sitting Pt Will Perform Upper Body Dressing: with set-up;sitting Pt Will Perform Lower Body Dressing: with supervision;sit to/from stand Pt Will Transfer to Toilet: with supervision;ambulating;regular height toilet Pt Will Perform Toileting - Clothing Manipulation and hygiene: with supervision;sit to/from stand Pt/caregiver will Perform Home Exercise Program: Right Upper extremity;With Supervision;With written HEP provided  OT Frequency: Min 2X/week   Barriers to D/C:            Co-evaluation               AM-PAC OT "6 Clicks" Daily Activity     Outcome Measure Help from  another person eating meals?: A Lot Help from another person taking care of personal grooming?: A Lot Help from another person toileting, which includes using toliet, bedpan, or urinal?: A Lot Help from another person bathing (including washing, rinsing, drying)?: A Lot Help from another person to put on and taking off regular upper body clothing?: A Little Help from another person to put on and taking off regular lower body clothing?: A Lot 6 Click Score: 13   End of Session Equipment Utilized During Treatment: Gait belt Nurse Communication: Mobility status  Activity Tolerance: Patient tolerated treatment well Patient left: in chair;with call bell/phone within reach;with chair alarm set;with family/visitor present  OT Visit Diagnosis: Unsteadiness on feet (R26.81);Muscle weakness (generalized) (M62.81);Other symptoms and signs involving cognitive function;Other symptoms and signs involving the nervous system (R29.898);Hemiplegia and hemiparesis Hemiplegia - Right/Left: Right Hemiplegia - dominant/non-dominant: Dominant Hemiplegia - caused by: Cerebral infarction                Time: 1610-9604 OT Time Calculation (min): 29 min Charges:  OT General Charges $OT Visit: 1 Visit OT Evaluation $OT Eval Moderate Complexity: 1 Mod OT Treatments $Self Care/Home Management : 8-22 mins  Fermina Mishkin H. OTR/L Supplemental OT, Department of rehab services [phone]  Nickholas Goldston R H. 10/01/2020, 10:33 AM

## 2020-10-01 NOTE — Evaluation (Signed)
Physical Therapy Evaluation Patient Details Name: Kelly [name] MRN: [id number] DOB: [birth year] Today's Date: 10/01/2020   History of Present Illness  Pt is an 86 year old female with a medical hx of GERD who presents with slurred speech, R sided facial droop, and R sided weakness that began 5 days prior to admission. Prior to this, pt has been experiencing a cough and congestion and received a Z-Pak from her provider. 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 MRI of brain revealed acute/subacute infarcts involving L basal ganglia and corona radiata with additional focus of restricted diffusion in L precentral gyrus. No tPA was administered. NIHSS = 5.  Clinical Impression  Pt presents with condition mentioned above and deficits mentioned below, see PT Problem List. PTA patient was living with her son, DIL, and 2 small grandchildren in a private residence and was independent with all functional mobility without AD. Family responsible for IADLs including meal prep and transportation and are available to assist as needed 24/7. Son present at bedside for interpretation. Pt is demonstrating below baseline cognitive status per son as she is oriented only to her name and to the fact that she was in a hospital. She displays decreased recognition of her deficits and difficulty problem-solving and coordinating how to safely maneuver in tight spaces. For example, she would cross one leg in front of another when trying to take steps to turn and transfer in tight spaces, placing her at risk for falls. She also displays possible R side neglect as she tends to drift towards the R and get proximal to objects on the R side often. Attempted to ambulate with a RW but her R hand would drag or drop off it often thus progressed to no UE support and only min guard assist  with gait with pt able to ambulate without trunk sway or LOB when not in tight spaces. She required minA to take steps or transfer in tight spaces though. Pt also able to negotiate 2 stairs with min guard-minA and UE support, but displayed poor R foot clearance throughout. During formal assessment of leg strength she demonstrated fairly symmetrical strength in bilat legs and no dysdiadochokinesia or sensation changes. Will continue to follow acutely. Pt would benefit from intensive therapy in the CIR setting to maximize safety and independence with all functional mob, but pt and son desire her to return home thus recommending HH PT.   Follow Up Recommendations Home health PT;Supervision/Assistance - 24 hour    Equipment Recommendations  None recommended by PT    Recommendations for Other Services       Precautions / Restrictions Precautions Precautions: Fall Precaution Comments: R hemiparesis, aphasic Restrictions Weight Bearing Restrictions: No      Mobility  Bed Mobility Overal bed mobility: Needs Assistance Bed Mobility: Sit to Supine       Sit to supine: Supervision   General bed mobility comments: Supervision for safety returning to supine in bed without use of bed rails and with bed flat.    Transfers Overall transfer level: Needs assistance Equipment used: Rolling walker (2 wheeled);1 person hand held assist Transfers: Sit to/from [employer] Sit to Stand: Min guard Stand pivot transfers: Min assist       General transfer comment: Coming to stand with increased effort and time with min guard for safety. Pt displays difficulty turning  in tight spaces to transfer between surfaces and requires cues and minA to maintain safety as she tends to let her legs cross one another.  Ambulation/Gait Ambulation/Gait assistance: Min guard;Min assist Gait Distance (Feet): 200 Feet Assistive device: Rolling walker (2 wheeled);None Gait Pattern/deviations:  Step-through pattern;Decreased stride length;Narrow base of support;Drifts right/left Gait velocity: decreased Gait velocity interpretation: <1.8 ft/sec, indicate of risk for recurrent falls General Gait Details: Pt ambulates at decreased speed compared to reported baseline. Attempted to ambulate with RW initially but pt tends to let R hand drag or fall off RW despite cues to correct, resulting in the RW going crooked and blocking the pt with gait. Thus, progressed to ambulating without UE support with no trunk sway or LOB noted, min guard for safety. She does have a narrow stance though and even crosses one foot in front of the other when turning in tight spaces resulting in intermittent minA for safety. Pt displayes possible R side neglect as she tends to drift towards the R and come close to bumping obstacles on the R.  Stairs Stairs: Yes Stairs assistance: Min guard;Min assist Stair Management: One rail Right;Step to pattern Number of Stairs: 2 General stair comments: Ascending and descending leading with either leg with 1-2 hands on either rail or 1 HHA and 1 hand on rail. Decreased R foot clearance on steps. Extra time required and min guard assist for safety with intermittent minA for steadying.  Wheelchair Mobility    Modified Rankin (Stroke Patients Only)       Balance Overall balance assessment: Needs assistance Sitting-balance support: Feet supported;No upper extremity supported Sitting balance-Leahy Scale: Fair Sitting balance - Comments: Patient able to maintain static sitting balance without external assist.   Standing balance support: During functional activity;No upper extremity supported Standing balance-Leahy Scale: Good Standing balance comment: No UE support and no overt LBb except when in tight spaces, min guard-minA for balance.                             Pertinent Vitals/Pain Pain Assessment: No/denies pain    Home Living Family/patient expects to  be discharged to:: Private residence Living Arrangements: Children (Son, DIL, and 2 small grandchildren.) Available Help at Discharge: Family;Available 24 hours/day Type of Home: House Home Access: Stairs to enter Entrance Stairs-Rails: None Entrance Stairs-Number of Steps: 2-3 small steps from garage. Home Layout: Two level;Able to live on main level with bedroom/bathroom Home Equipment: None      Prior Function Level of Independence: Independent         Comments: I with ADLs/IADLs and functional mobility. Daughter in law responsible for meal prep and homemaking tasks. Family provides transportation.     Hand Dominance   Dominant Hand: Right    Extremity/Trunk Assessment   Upper Extremity Assessment Upper Extremity Assessment: Defer to OT evaluation    Lower Extremity Assessment Lower Extremity Assessment: Overall WFL for tasks assessed (MMT scores of 4-5 grossly in bilat legs, no dysdiadachokinesia noted, and denies numbness/tingling and reports symmetrical sensation to light touch in bilat legs)    Cervical / Trunk Assessment Cervical / Trunk Assessment: Normal  Communication   Communication: Other (comment);Prefers language other than English (Difficult to assess 2/2 language barrier. Son states that patient isn't making any sense when responding to questions.)  Cognition Arousal/Alertness: Awake/alert Behavior During Therapy: Flat affect Overall Cognitive Status: Impaired/Different from baseline Area of Impairment: Orientation;Attention;Memory;Following commands;Awareness;Problem solving;Safety/judgement  Orientation Level: Disoriented to;Time;Situation;Person (did not know DOB or current date but able to state she was in a hospital, but did not know situation) Current Attention Level: Sustained Memory: Decreased short-term memory Following Commands: Follows one step commands inconsistently;Follows multi-step commands with increased  time;Follows one step commands with increased time Safety/Judgement: Decreased awareness of safety;Decreased awareness of deficits Awareness: Emergent Problem Solving: Slow processing;Requires verbal cues;Difficulty sequencing General Comments: Patient oriented to name but not D.O.B, current date, exact location, or situation. Son reports hx of some confusion at baseline with dates. Pt displays some possible R side neglect along with poor sequencing when in tight spaces, impacting her safety. Requires extra time to respond to all cues.      General Comments      Exercises     Assessment/Plan    PT Assessment Patient needs continued PT services  PT Problem List Decreased balance;Decreased mobility;Decreased coordination;Decreased cognition;Decreased knowledge of use of DME;Decreased safety awareness       PT Treatment Interventions DME instruction;Gait training;Stair training;Functional mobility training;Therapeutic activities;Therapeutic exercise;Balance training;Neuromuscular re-education;Cognitive remediation;Patient/family education    PT Goals (Current goals can be found in the Care Plan section)  Acute Rehab PT Goals Patient Stated Goal: to go home PT Goal Formulation: With patient/family Time For Goal Achievement: 10/15/20 Potential to Achieve Goals: Good    Frequency Min 3X/week   Barriers to discharge        Co-evaluation               AM-PAC PT "6 Clicks" Mobility  Outcome Measure Help needed turning from your back to your side while in a flat bed without using bedrails?: A Little Help needed moving from lying on your back to sitting on the side of a flat bed without using bedrails?: A Little Help needed moving to and from a bed to a chair (including a wheelchair)?: A Little Help needed standing up from a chair using your arms (e.g., wheelchair or bedside chair)?: A Little Help needed to walk in hospital room?: A Little Help needed climbing 3-5 steps with a  railing? : A Little 6 Click Score: 18    End of Session Equipment Utilized During Treatment: Gait belt Activity Tolerance: Patient tolerated treatment well Patient left: in bed;with call bell/phone within reach;with bed alarm set;with family/visitor present Nurse Communication: Mobility status PT Visit Diagnosis: Unsteadiness on feet (R26.81);Other abnormalities of gait and mobility (R26.89);Difficulty in walking, not elsewhere classified (R26.2);Other symptoms and signs involving the nervous system (R29.898);Hemiplegia and hemiparesis Hemiplegia - Right/Left: Right Hemiplegia - dominant/non-dominant: Dominant Hemiplegia - caused by: Cerebral infarction    Time: 9323-5573 PT Time Calculation (min) (ACUTE ONLY): 31 min   Charges:   PT Evaluation $PT Eval Moderate Complexity: 1 Mod PT Treatments $Gait Training: 8-22 mins        Raymond Gurney, PT, DPT Acute Rehabilitation Services  Pager: [phone] Office: [phone]   Jewel Baize 10/01/2020, 3:20 PM

## 2020-10-01 NOTE — Progress Notes (Signed)
Echocardiogram 2D Echocardiogram has been performed.  Kelly [name] 10/01/2020, 9:19 AM

## 2020-10-01 NOTE — Progress Notes (Signed)
Bilateral lower extremity venous study completed.      Please see CV Proc for preliminary results.   Daevion Navarette, RVT  

## 2020-10-01 NOTE — TOC Initial Note (Addendum)
Transition of Care [facility]) - Initial/Assessment Note    Patient Details  Name: Kelly [name] MRN: [id number] Date of Birth: Aug 08, 1934  [facility] of [facility]) CM/SW Contact:    Pollie Friar, RN Phone Number: 10/01/2020, 3:34 PM  Clinical Narrative:         PCP: Dr Tamala Julian at [facility] on [address].          Recommendations are for CIR but family wants to take pt home when medically ready. CM met with the patient and her son (who speaks english) and went over the recommendations. He prefers [facility] services. Family can provide 24 hour supervision. He did not have a preference for [facility]. [facility] has accepted the referral.  No DME needs per PT.  TOC following for further d/c needs.   Expected Discharge Plan: [place] Barriers to Discharge: Continued Medical Work up   Patient Goals and CMS Choice   CMS Medicare.gov Compare Post Acute Care list provided to:: Patient Represenative (must comment) Choice offered to / list presented to : Adult Children  Expected Discharge Plan and Services Expected Discharge Plan: [place]   Discharge Planning Services: CM Consult Post Acute Care Choice: [place] arrangements for the past 2 months: [place]: PT,OT,Speech Therapy HH Agency: [place] Date [place]: 10/01/20      Prior Living Arrangements/Services Living arrangements for the past 2 months: Single Family Home Lives with:: Adult Children Patient language and need for interpreter reviewed:: Yes Do you feel safe going back to the place where you live?: Yes      Need for Family Participation in Patient Care: Yes (Comment) Care giver support system in place?: Yes (comment)   Criminal Activity/Legal Involvement Pertinent to Current Situation/Hospitalization: No - Comment as needed  Activities of Daily Living      Permission Sought/Granted                   Emotional Assessment Appearance:: Appears stated age         Psych Involvement: No (comment)  Admission diagnosis:  CVA (cerebral vascular accident) ([place]) [I63.9] Cerebrovascular accident (CVA), unspecified mechanism ([place]) [I63.9] Patient Active Problem List   Diagnosis Date Noted  . GERD (gastroesophageal reflux disease) 09/30/2020  . Prediabetes 09/30/2020  . Elevated blood pressure reading 09/30/2020  . CVA (cerebral vascular accident) ([place]) 09/29/2020   PCP:  Patient, No Pcp Per Pharmacy:   [id number] - [place], [place] - [address] [place] [address] Phone: [phone] Fax: [phone]     Social Determinants of Health (SDOH) Interventions    Readmission Risk Interventions No flowsheet data found.

## 2020-10-01 NOTE — Plan of Care (Signed)

## 2020-10-01 NOTE — Discharge Instructions (Signed)
Please buy BP monitoring device at [facility] or [facility] and start home BP monitoring. Higher number of BP should be within 120-150, no lower than 100-110. If lower than that, keep patient lying in bed and call your family doctor for further instruction.   Continue the medication prescribed to you, and you will be called by heart doctor later to schedule for outpatient placement of heart monitoring device   You will also be called for neurology clinic follow up.    ?i?u tr? ??t qu? do thi?u mu no c?c b? b?ng alteplase Alteplase Treatment for Ischemic Stroke  ??t qu? do thi?u mu no c?c b? do c?c mu ?ng lm t?c ngh?n dng mu ??n no gy ra. Alteplase l m?t lo?i thu?c c th? lm tan cc c?c mu ?ng ny. Thu?c c th? gip ?i?u tr? ??t qu? n?u ???c cho dng ngay sau khi cc tri?u ch?ng ??t qu? b?t ??u. Thu?c hi?u qu? nh?t khi ???c dng trong vng 0-4 ti?ng sau khi kh?i pht tri?u ch?ng. Tr??c khi cho dng thu?c ny, chuyn gia ch?m [place] s?c kh?e s? c?n th?n cn nh?c xem ph??ng php ?i?u tr? ny c ph h?p hay khng d?a vo ?? tu?i, tnh tr?ng v cc y?u t? khc c?a qu v?. Hy cho chuyn gia ch?m [place] s?c kh?e bi?t v?:  B?t k? d? ?ng no m qu v? c.  T?t c? cc lo?i thu?c m qu v? ?ang s? d?ng, bao g?m c? thu?c lm long mu, vitamin, th?o d??c v thu?c khng k ??n.  B?t k? tnh tr?ng b?nh l no qu v? c.  B?t k? b?nh l v? mu no m qu v? c.  B?t k? ch? no ?ang ch?y mu trong 21 ngy tr??c, bao g?m c? ch?y mu ???ng tiu ha (GI) ho?c ch?y mu m ??o.  B?t k? ph?u thu?t ho?c th? thu?t no qu v? ? c.  Qu v? c ?ang mang thai ho?c c th? c thai hay khng.  Cc tri?u ch?ng ??t qu? c?a qu v? b?t ??u khi no. C nh?ng nguy c? no? Ni chung, ?y l m?t ph??ng php ?i?u tr? an ton. Tuy nhin, cc v?n ?? c th? x?y ra, bao g?m:  Ch?y mu trong no.  Ch?y mu trong cc b? ph?n khc c?a c? th?.  Ph?n ?ng d? ?ng. ?i?u g x?y ra tr??c khi ?i?u tr??  Chuyn gia ch?m [place] s?c  kh?e s? khm th?c th? v h?i v? b?nh s? chi ti?t.  Nhi?t ??, huy?t p, nh?p tim v nh?p th? c?a qu v? (cc d?u hi?u sinh t?n) s? ???c theo di ch?t ch?. N?u c?n, qu v? c th? ???c cho dng thu?c ?? ?i?u ch?nh huy?t p.  Qu v? c th? ???c lm cc ki?m tra, bao g?m xt nghi?m mu ho?c cc nghin c?u hnh ?nh, ch?ng h?n nh? ch?p CT. ?i?u g x?y ra trong qu trnh ?i?u tr??  M?t ???ng truy?n t?nh m?ch (IV) s? ???c lu?n vo m?t trong cc t?nh m?ch c?a qu v?.  Qu v? s? ???c cho dng Alteplase qua ???ng t?nh m?ch ny, th??ng l trong 1 gi?Marland Kitchen  Trong m?t s? tr??ng h?p, thu?c ny c th? ???c cho dng tr?c ti?p vo ??ng m?ch b? t?c qua m?t ?ng m?nh (?ng thng). ?ng ny th??ng ???c lu?n vo ??ng m?ch ? ph?n trn c?a chn qu v?.  Cc d?u hi?u sinh t?n v ch?c n?ng no c?a qu v? s? ???c theo di ch?t ch?  khi qu v? dng thu?c ny.  N?u b? ch?y mu, s? ng?ng dng thu?c v b?t ??u li?u php ph h?p. Th? thu?t ny c th? khc nhau gi?a cc chuyn gia ch?m [place] s?c kh?e v cc b?nh vi?n. Ti c th? d? ki?n ?i?u g s? x?y ra sau khi ?i?u tr??  Qu v? s? ???c ??i ng? nhn vin ch?m [place] s?c kh?e c?a qu v? theo di th??ng xuyn trong khoa ?i?u tr? tch c?c ho?c khoa ?i?u tr? ??t qu?Sander Nephew v? s? ???c m?t chuyn gia tr? li?u ngn ng?, chuyn gia v?t l tr? li?u v chuyn gia tr? li?u ngh? nghi?p ki?m tra.  N?u ???c ??t ?ng thng, qu v? c th? b? b?m tm, ?au nh?c v s?ng ? ch? lu?n ?ng thng.  C th? m?t vi ngy, vi tu?n, ho?c th?m ch vi thng ?? xc ??nh ??y ?? vi?c qu v? ?p ?ng t?t ??n m?c no v?i ?i?u tr? b?ng alteplase. Lm theo nh?ng h??ng d?n ny ? b?nh vi?n: Ho?t ??ng  Khng ra kh?i gi??ng m khng c s? tr? gip. Vi?c ny l v s? an ton c?a qu v?.  H?n ch? ho?t ??ng sau khi ?i?u tr?Marland Kitchen  Tham gia vo cc ch??ng trnh ph?c h?i ch?c n?ng sau ??t qu? theo ch? d?n c?a chuyn gia ch?m [place] s?c kh?e. H??ng d?n chung  Ch? s? d?ng thu?c khng k ??n v thu?c k ??n theo ch? d?n c?a chuyn gia  ch?m [place] s?c kh?e.  Cho y t ho?c chuyn gia ch?m [place] s?c kh?e bi?t ngay l?p t?c n?u qu v? c b?t c? ch? no ?ang ch?y mu, b?m tm ho?c th??ng t?n.  S? d?ng bn ch?i ?nh r?ng lng m?m. ?nh r?ng nh? nhng. Yu c?u tr? gip ngay l?p t?c n?u qu v? c:  Ma?u trong ch?t nn, trong phn, ho?c trong n??c ti?u.  B? t ng ho?c t?i n?n nghim tr?ng, ho?c qu v? b? ??p vo ??u.  Cc tri?u ch?ng c?a ph?n ?ng d? ?ng nh? l pht ban ho?c kh th?.  B?t k? tri?u ch?ng ??t qu? no. "BE FAST" l cch d? dng ?? ghi nh? cc d?u hi?u c?nh bo chnh: ? B - Th?ng b?ng (Balance). Cc d?u hi?u l chng m?t, ??t ng?t ?i l?i kh kh?n ho?c m?t th?ng b?ng. ? E - M?t (Eyes). Cc d?u hi?u ny bao g?m nhn m? ho?c ??t ng?t thay ??i th? l?c. ? F - M?t (Face). Cc d?u hi?u l ??t ng?t y?u ho?c t ? m?t, ho?c m?t ho?c mi m?t x? Deanglo Hissong?ng ? m?t bn. ? A - Cnh tay (Arms). Cc d?u hi?u l y?u ho?c t ? m?t cnh tay. ?i?u ny x?y ra ??t ng?t v th??ng ? m?t bn c? th?. ? S - L?i ni (Speech). Cc d?u hi?u l ??t ng?t kh ni, ni ng?ng ho?c kh hi?u ng??i khc ni g. ? T - Th?i gian (Time). Th?i gian c?n g?i d?ch v? c?p c?u. Ghi l?i th?i gian cc tri?u ch?ng b?t ??u.  Cc d?u hi?u khc c?a ??t qu?, ch?ng h?n nh?: ? ?au ??u r?t d? d?i, ??t ng?t, khng r nguyn nhn. ? Bu?n nn ho?c nn. ? Co gi?t. Nh?ng tri?u ch?ng ny c th? l bi?u hi?n c?a m?t v?n ?? nghim tr?ng c?n c?p c?u. Khng ch? xem tri?u ch?ng c h?t khng. Hy ?i khm ngay l?p t?c. G?i cho d?ch v? c?p c?u t?i ??a ph??ng (911 ? [place]?).  Khng t? li xe ??n b?nh vi?n. Tm t?t  Alteplase l m?t lo?i thu?c c th? lm tan c?c mu ?ng. Thu?c c th? ???c dng ?? khai thng ??ng m?ch b? t?c ngh?n trong khi ??t qu? do thi?u mu no c?c b?.  ?? c hi?u qu?, ph?i dng thu?c ny trong th?i gian r?t ng?n sau khi cc tri?u ch?ng ??t qu? b?t ??u, trong vng 4 ti?ng sau khi kh?i pht tri?u ch?ng.  Qu v? s? ???c ??i ng? nhn vin ch?m [place] s?c kh?e c?a qu v? theo di  ch?t ch? trong v sau khi dng alteplase.  Yu c?u tr? gip ngay l?p t?c n?u qu v? c cc d?u hi?u ch?y mu ho?c c cc tri?u ch?ng ??t qu?Marland Kitchen Thng tin ny khng nh?m m?c ?ch thay th? cho l?i khuyn m chuyn gia ch?m [place] s?c kh?e ni v?i qu v?. Hy b?o ??m qu v? ph?i th?o lu?n b?t k? v?n ?? g m qu v? c v?i chuyn gia ch?m [place] s?c kh?e c?a qu v?. Document Revised: 04/11/2019 Document Reviewed: 04/11/2019 Elsevier Patient Education  2020 [place]? thu?t c?t b? c?c ngh?n m?ch b?ng d?ng c? c? h?c ?? ?i?u tr? ??t qu? do thi?u mu no c?c b? Mechanical Thrombectomy for Ischemic Stroke Th? thu?t c?t b? c?c ngh?n m?ch b?ng d?ng c? c? h?c l m?t th? thu?t ?? ?i?u tr? ??t qu? do thi?u mu no c?c b?. ??t qu? do thi?u mu no c?c b? do m?t c?c mu ?ng ch?n ngu?n cung c?p mu ??n no gy ra. Qu v? c th? ???c lm th? thu?t c?t b? c?c ngh?n m?ch b?ng d?ng c? c? h?c n?u qu v? c m?t ch? t?c ngh?n ? m?ch mu l?n trong no. ??t qu? do thi?u mu c?c b? l m?t tr??ng h?p c?p c?u n?i khoa v ph?i ???c ?i?u tr? ngay ?? ng?n ng?a th??ng t?n no v?nh vi?n. Hy cho chuyn gia ch?m [place] s?c kh?e bi?t v?:  B?t k? d? ?ng no m qu v? c.  T?t c? cc lo?i thu?c m qu v? ?ang s? d?ng, bao g?m c? thu?c lm long mu, thu?c ?i?u tr? b?nh tim, vitamin, th?o d??c v thu?c khng k ??n.  B?t k? v?n ?? no m qu v? ho?c cc thnh vin trong gia ?nh ? g?p ph?i v?i thu?c gy m.  B?t k? b?nh l v? mu no m qu v? c.  B?t k? ph?u thu?t no qu v? ? c.  B?t k? tnh tr?ng b?nh l no qu v? c.  Qu v? c ?ang mang thai ho?c c th? c thai hay khng. C nh?ng nguy c? no? Ni chung, ?y l m?t th? thu?t an ton. Tuy nhin, cc v?n ?? c th? x?y ra, bao g?m:  Ch?y mu, bao g?m ch?y mu ? ch? ??t qu? ho?c khu v?c ??t ?ng thng.  Ph?n ?ng d? ?ng v?i cc lo?i thu?c ho??c thu?c nhu?m.  Nhi?m trng.  Gy th??ng t?n ca?c c?u tru?c ho??c cc c? quan kha?c.  V? m?ch mu c c?c mu  ?ng.  B? m?t ??t qu? khc, ho?c ??t qu? tr?m tr?ng h?n. ?i?u g x?y ra tr??c khi lm th? thu?t?  Qu v? c th? ???c ch?p CT (ch?p c?t l?p vi tnh) ho?c ch?p MRI (ch?p c?ng h??ng t?) no.  Chuyn gia ch?m  s?c kh?e s? khm th?c th? v h?i v? b?nh s? chi ti?t c?a qu v?.  Hy h?i chuyn gia  ch?m [place] s?c kh?e c?a qu v? xem s? th?c hi?n nh?ng b??c no ?? gip ng?n ng?a nhi?m trng. Nh?ng b??c ny c th? bao g?m r?a da b?ng x bng di?t m?m b?nh.  M?t ???ng truy?n t?nh m?ch (IV) s? ???c lu?n vo m?t trong cc t?nh m?ch c?a qu v?.  Qu v? c th? ???c cho dng m?t lo?i thu?c ?? lm tan c?c mu ?ng (IV alteplase). Thu?c ny c th? ???c cho dng trong vng vi gi? ??u b? ??t qu? do thi?u mu no c?c b?. Thu?c c?ng c th? ???c cho dng trong khi lm th? thu?t. ?i?u g x?y ra trong qu trnh th?c hi?n th? thu?t?   Qu v? s? ???c cho dng m?t ho?c nhi?u trong s? nh?ng lo?i thu?c sau qua ???ng t?nh m?ch (IV): ? M?t lo?i thu?c ?? gip quy? vi? th? gin (thu?c an th?n). ? M?t loa?i thu?c lm t vng ph?u thu?t (gy t cu?c b?). ? M?t lo?i thu?c lm qu v? ng? (thu?c gy m ???ng toa?n thn).  M?t v?t m? nh? s? ???c t?o ? khu v?c ?i trn c?a qu v? ?? xc ??nh v? tr m?t ??ng m?ch l?n.  M?t ?ng di, m?nh (?ng thng) s? ???c lu?n vo ??ng m?ch ? v ?i ln pha no qu v?.  Ki?m tra hnh ?nh (ch?p m?ch) s? ???c th?c hi?n ?? tm ?i?m chnh xc ?? ??t ?ng thng trong no qu v?.  M?t ci gi? hnh gi?ng nh? s?i dy (?ng l?ng stent), ho?c thi?t b? ht, s? ???c ko di ra t? ?ng thng ?? lo?i b? c?c mu ?ng.  ?ng thng s? ???c tho ra khi k?t thc th? thu?t.  B?ng (g?c) s? ???c ph? ln v? tr ??t ?ng thng. Th? thu?t ny c th? khc nhau gi?a cc chuyn gia ch?m [place] s?c kh?e v cc b?nh vi?n. Ti c th? d? ki?n ?i?u g s? x?y ra sau khi lm th? thu?t?  Qu v? s? ???c ??i ng? ch?m [place] s?c kh?e c?a qu v? theo di ch?t ch? trong khoa ?i?u tr? tch c?c ho?c khoa ?i?u tr? ??t qu?Sander Nephew v? s?  ???c m?t chuyn gia tr? li?u ngn ng?, chuyn gia v?t l tr? li?u, ho?c chuyn gia tr? li?u ngh? nghi?p ?nh gi.  Sau ?i?u tr?, qu v? th??ng s? c v?t b?m, ?au nh?c v s?ng ? ch? ??t ?ng thng. Tun th? nh?ng h??ng d?n ny ? nh: Ch?m [place] ch? lu?n ?ng thng  Tun th? cc ch? d?n c?a chuyn gia ch?m [place] s?c kh?e v? cch ch?m [place] ch? ??t ?ng thng. ??m b?o qu v?: ? R?a tay b?ng x phng v n??c tr??c v sau khi qu v? thay b?ng g?c. N?u khng c x phng v n??c, hy dng thu?c st trng tay. ? Thay b?ng theo ch? d?n c?a chuyn gia ch?m [place] s?c kh?e.  Ki?m tra ch? ??t ?ng thng m?i ngy xem c d?u hi?u nhi?m trng khng. Ki?m tra xem c: ? ??, s?ng n?, ho?c ?au nhi?u h?n. ? Di?ch ho?c mu. ? ?m. ? M? ho?c mi hi. Ho?t ??ng  Khng nng b?t c? v?t gi? n?ng qu 10 lb (4,5 kg), ho??c qu gi??i h?n m qu v? ???c ch? d?n, cho ??n khi chuyn gia ch?m  s?c kh?e c?a qu v? ni r?ng vi?c ? l an ton.  Trong 2-3 ngy ??u tin sau khi lm th? thu?t, ho?c theo th?i gian ? ch? d?n: ? Hessie Diener  leo c?u thang cng nhi?u cng t?t. ? Khng ng?i x?m.  Gi?i h?n ho?t ??ng theo ch? d?n c?a chuyn gia ch?m [place] s?c kh?e. H??ng d?n chung  Ch? s? d?ng thu?c khng k ??n v thu?c k ??n theo ch? d?n c?a chuyn gia ch?m [place] s?c kh?e.  Khng s? d?ng b?t k? s?n ph?m no c nicotine ho?c thu?c l, ch?ng ha?n nh? thu?c l d?ng ht, thu?c l ?i?n t? v thu?c l d?ng nhai. N?u qu v? c?n gip ?? ?? cai thu?c, hy h?i chuyn gia ch?m [place] s?c kh?e.  Tun th? t?t c? cc l?n khm theo di theo ch? d?n c?a chuyn gia ch?m [place] s?c kh?e. ?i?u ny c vai tr quan tr?ng. Hy lin l?c v?i chuyn gia ch?m [place] s?c kh?e n?u qu v? c:  ??, s?ng n? ho?c ?au nhi?u h?n xung quanh ch? lu?n ?ng thng.  S?t. Yu c?u tr? gip ngay l?p t?c n?u qu v? c:  B?t k? tri?u ch?ng ??t qu? no. "BE FAST" l cch d? dng ?? ghi nh? cc d?u hi?u chnh c?nh bo ??t qu?: ? B - Th?ng b?ng (Balance). Cc d?u hi?u l chng m?t, ??t ng?t ?i  l?i kh kh?n ho?c m?t th?ng b?ng. ? E - M?t (Eyes). Cc d?u hi?u ny bao g?m nhn m? ho?c ??t ng?t thay ??i th? l?c. ? F - M?t (Face). Cc d?u hi?u l ??t ng?t y?u ho?c t ? m?t, ho?c m?t ho?c mi m?t x? Emony Dormer?ng ? m?t bn. ? A - Cnh tay (Arms). Cc d?u hi?u l y?u ho?c t ? m?t cnh tay. ?i?u ny x?y ra ??t ng?t v th??ng ? m?t bn c? th?. ? S - L?i ni (Speech). Cc d?u hi?u l ??t ng?t kh ni, ni nh?u ho?c kh hi?u ng??i khc ni g. ? T - Th?i gian (Time). Th?i gian c?n g?i d?ch v? c?p c?u. Ghi l?i th?i gian cc tri?u ch?ng b?t ??u.  Cc d?u hi?u khc c?a ??t qu?, ch?ng h?n nh?: ? ?au ??u d? d?i, ??t ng?t khng r nguyn nhn. ? Bu?n nn ho?c nn. ? Co gi?t.  Cc tri?u ch?ng sau ?y ? trong ho?c g?n khu v?c ??t ?ng thng: ? S?ng, ??c bi?t l s?ng x?y ra nhanh. ? Ch?y mu khng ng?ng khi qu v? ?n lin t?c ln ch? ?o?. ? Khu v?c ? tr? nn ti nh?t, l?nh, ?au nhi ho?c t. Nh?ng tri?u ch?ng ny c th? l bi?u hi?n c?a m?t v?n ?? nghim tr?ng c?n c?p c?u. Khng ch? xem tri?u ch?ng c h?t khng. Hy ?i khm ngay l?p t?c. G?i cho d?ch v? c?p c?u t?i ??a ph??ng (911 ? [place]?). Khng t? li xe ??n b?nh vi?n. Tm t?t  Th? thu?t c?t b? c?c ngh?n b?ng d?ng c? c? h?c l m?t th? thu?t khai thng m?ch mu b? t?c gy ra ??t qu? do thi?u mu no c?c b?.  ??t qu? l m?t tr??ng h?p c?p c?u n?i khoa v c?n ???c ?i?u tr? cng s?m cng t?t sau khi tri?u ch?ng b?t ??u.  Sau khi tr? v? nh, qu v? ch? dng t?t c? cc lo?i thu?c theo ch? d?n c?a chuyn gia ch?m [place] s?c kh?e.  Yu c?u tr? gip ngay l?p t?c n?u qu v? c cc d?u hi?u ch?y mu ho?c nhi?m trng, ho?c n?u qu v? c cc tri?u ch?ng ??t qu?Marland Kitchen Thng tin ny khng nh?m m?c ?ch thay th? cho l?i [place] m [place] gia ch?m  [place] s?c kh?e ni v?i qu v?. Hy b?o ??m qu v? ph?i th?o lu?n b?t k? v?n ?? g m qu v? c v?i chuyn gia ch?m [place] s?c kh?e c?a qu v?. Document Revised: 11/23/2018 Document Reviewed: 11/23/2018 Elsevier Patient Education  [place].   ??t qu? do thi?u mu no c?c b? Ischemic Stroke  ??t qu? do thi?u mu no c?c b? (tai bi?n m?ch mu no, hay CVA) l s? ch?t ??t ng?t c?a m no x?y ra khi m?t vng no khng nh?n ?? -xi. ?y l tr??ng h?p c?p c?u y khoa ph?i ???c ?i?u tr? ngay l?p t?c. ??t qu? do thi?u ma?u no cu?c b? c th? lm m?t v?nh vi?n ch?c n?ng no. ?i?u ny c th? lm cho cch th?c th?c hi?n ch?c n?ng c?a cc b? ph?n khc nhau trong c? th? c v?n ??. Nguyn nhn g gy ra? Tnh tr?ng ny do ngu?n cung c?p -xi ??n m?t vng no b? gi?m, c th? l do k?t qu? c?a:  C?c mu ?ng nh? (v?t ngh?n m?ch) ho?c tch t? m?ng bm trong cc m?ch mu (ch?ng x? v?a ??ng m?ch) lm t?c dng mu trong no.  Nh?p tim b?t th??ng (rung nh?).  ??ng m?ch trong ??u ho?c c? b? t?c ho?c b? t?n th??ng. ?i khi, khng r nguyn nhn gy ??t qu? (khng bi?t c?n nguyn). ?i?u g lm t?ng nguy c?? M?t s? y?u t? nh?t ??nh c th? lm qu v? d? b? tnh tr?ng ny h?n. M?t s? trong nh?ng y?u t? ny l nh?ng v?n ?? m qu v? c th? thay ??i, ch?ng h?n nh?:  Bo ph.  Ht thu?c l.  S? d?ng thu?c trnh [place] ???ng u?ng, ??c bi?t l n?u qu v? c?ng s? d?ng thu?c l.  Khng ho?t ??ng th? ch?t.  S? d?ng qu nhi?u r??u.  S? d?ng ma ty b?t h?p php, ??c bi?t l cocaine v methamphetamine. Cc y?u t? nguy c? khc bao g?m:  Huy?t p cao (t?ng huy?t p).  Cholesterol cao.  B?nh ti?u ???ng.  B?nh tim.  L ng??i M? g?c Phi, th? dn M?, ng??i Hispanic, ho??c th? dn [place].  Trn 60 tu?i.  Ti?n s? gia ?nh b? b?nh ??t qu?Marland Kitchen  Ti?n s? b? c?c mu ?ng, ??t qu?, ho?c c?n thi?u mu no c?c b? thong qua (TIA).  B?nh h?ng c?u hnh li?m.  L ph? n? c ti?n s? b? ti?n s?n gi?t.  Ch?ng ?au n?a ??u.  Ng?ng th? khi ng?.  Nh?p tim b?t th??ng, ch?ng h?n nh? rung nh?.  Cc b?nh vim m?n tnh, ch?ng h?n nh? vim kh?p d?ng th?p ho?c b?nh luput.  R?i lo?n ?ng mu (tnh tr?ng t?ng ?ng). Cc d?u hi?u ho?c tri?u ch?ng l  g? Nh?ng tri?u ch?ng c?a tnh tr?ng ny th??ng pht tri?n ??t ng?t, ho?c quy? vi? c th? nh?n th?y cc tri?u ch?ng ? sau khi ngu? d?y. Cc tri?u ch?ng c th? bao g?m ??t nhin:  C?m th?y y?u ho?c t b ? m?t, cnh tay, ho?c chn, ??c bi?t l ? m?t bn c?a c? th?.  Kh ?i b? ho?c kh c? ??ng cnh tay ho?c chn.  M?t th?ng b?ng ho?c m?t ?i?u ph?i.  L l?n.  Ni l?p (ch?ng lo?n c?n ngn).  Kh ni, kh hi?u l?i ni, ho?c c? hai (m?t ngn ng?).  Thay ??i th? l?c--ch?ng h?n nh? song th?, m?t m?, ho?c khng nhn th?y g--? m?t ho?c c? hai m?t.  Chng  m?t.  Bu?n nn v nn.  ?au ??u d? d?i khng r nguyn nhn. ?au ??u th??ng ???c m t? nh? l c?n ?au ??u t?i t? nh?t t?ng tra?i qua. N?u c th?, hy ghi ch l?i th?i gian chnh xc l?n cu?i cng qu v? c c?m gic nh? bnh th??ng v cc tri?u ch?ng c?a qu v? b?t ??u lc m?y gi?Marland Kitchen Hy ni cho chuyn gia ch?m [place] s?c kh?e c?a qu v? bi?t. N?u cc tri?u ch?ng Lashonna Rieke?t hi?n v bi?n m?t, ?y c th? l d?u hi?u ??t qu? c?nh bo, hay TIA. Hy yu c?u s? gip ?? ngay l?p t?c, cho d qu v? c?m th?y ?? h?n. Ch?n ?on tnh tr?ng ny nh? th? no? Tnh tr?ng ny c th? ???c ch?n ?on d?a vo:  Cc tri?u ch?ng, b?nh s? c?a qu v? v khm th?c th?.  Ch?p CT no.  Ch?p MRI.  Ch?p m?ch CT. Ki?m tra ny s? d?ng my vi tnh ?? ch?p X quang ??ng m?ch c?a qu v?. M?t lo?i thu?c nhu?m c th? ???c tim vo mu c?a qu v? ?? b?c l? r h?n ph?n bn trong m?ch mu.  Ch?p MRI m?ch mu. ?y l lo?i MRI ???c s? d?ng ?? ?nh gi m?ch mu.  Ch?p m?ch no. Ki?m tra ny dng tia X v thu?c nhu?m ?? b?c l? r m?ch mu trong no v c?. Qu v? c th? c?n ??n g?p chuyn gia ch?m [place] s?c kh?e chuyn v? ch?m [place] ??t qu?Marland Kitchen Chuyn gia v? ??t qu? c th? khm tr?c ti?p ho?c thng qua ph??ng ti?n truy?n thng b?ng cch dng ?i?n tho?i ho?c cng ngh? truy?n hnh (h? th?ng y t? t? xa). Cc ki?m tra khc c?ng c th? ???c th?c hi?n ?? tm ra nguyn nhn gy ??t qu?, ch?ng h?n  nh?:  ?i?n tm ?? (ECG).  Theo di tim lin [place] m tim.  Siu m tim qua th?c qu?n (TEE).  Siu m ??ng m?ch c?nh.  Ch?p tu?n hon no.  Xt nghi?m mu.  Theo di khi ng? ?? ki?m tra xem c ng?ng th? khi ng? khng. Tnh tr?ng ny ???c ?i?u tr? nh? th? no? ?i?u tr? tnh tr?ng ny s? ph? thu?c vo th?i gian di?n ra, m?c ?? n?ng v nguyn nhn gy ra cc tri?u ch?ng c?a qu v? v vng no b? ?nh h??ng. M?t ?i?u r?t quan tr?ng la? pha?i ?i?u tr? ngay khi co? d?u hi?u ??u tin c?a ca?c tri?u ch?ng ??t qu?Marland Kitchen M?t s? bi?n php ?i?u tr? hi?u qu? h?n n?u ???c th?c hi?n trong vng 3-6 ti?ng k? t? lc b?t ??u cc tri?u ch?ng ??t qu?Marland Kitchen Nh?ng bi?n php ?i?u tr? ban ??u ny c th? bao g?m:  Aspirin.  Thu?c ki?m sot huy?t p.  Thu?c ???c cho dng d??i d?ng tim ?? lm tan c?c mu ?ng (tan huy?t kh?i).  Cc bi?n php ?i?u tr? ???c ti?n hnh tr?c ti?p ln ??ng m?ch b? ?nh h??ng ?? lo?i b? ho?c lm tan c?c mu ?ng. Cc ty ch?n ?i?u tr? khc c th? bao g?m:   xi.  D?ch truy?n qua ???ng t?nh m?ch.  Thu?c lm long mu (thu?c ch?ng ?ng mu ho?c ch?ng ng?ng t?p ti?u c?u).  Th? thu?t lm t?ng l?u l??ng mu. Dng thu?c v thay ??i ch? ?? ?n c th? gip ?i?u tr? v qu?n l cc y?u t? nguy c? gy ??t qu?, ch?ng h?n nh? b?nh ti?u ???ng, cholesterol cao v huy?t p cao. Sau  khi b? ??t qu?, qu v? c th? lm vi?c v?i cc chuyn gia v?t l tr? li?u, chuyn gia li?u php ngn ng?, s?c kh?e tm th?n, ho?c chuyn gia li?u php lao ??ng ?? gip qu v? h?i ph?c. Tun th? nh?ng h??ng d?n ny ? nh: Thu?c  Ch? s? d?ng thu?c khng k ??n v thu?c k ??n theo ch? d?n c?a chuyn gia ch?m [place] s?c kh?e.  N?u qu v? ???c ch? d?n dng thu?c lm long mu, ch?ng h?n nh? aspirin ho?c thu?c ch?ng ?ng mu, hy dng chnh xc theo ch? d?n c?a chuyn gia ch?m [place] s?c kh?e c?a qu v?. ? Dng qu nhi?u thu?c lm long mu c th? gy ch?y mu. ? N?u qu v? khng dng ?? thu?c lm long mu, qu v? s? khng c  s? b?o v? c?n thi?t ?? ch?ng l?i m?t c?n ??t qu? khc v nh?ng v?n ?? khc.  Hi?u r cc tc d?ng ph? c?a vi?c dng thu?c ch?ng ?ng mu. Khi dng lo?i thu?c ny, ??m b?o r?ng qu v?: ? p ln b?t c? v?t c?t no lu h?n bnh th??ng. ? Ni cho nha s? v cc chuyn gia ch?m [place] s?c kh?e khc bi?t r??ng quy? vi? ?ang dng thu?c ch?ng ?ng mu tr??c khi quy? vi? c b?t k? th? thu?t na?o co? th? gy ch?y mu. ? Trnh cc ho?t ??ng c th? gy ra ch?n th??ng ho?c th??ng t?n. ?n v u?ng  Tun th? ch? d?n c?a chuyn gia ch?m [place] s?c kh?e v? ch? ?? ?n.  ?n th?c ?n c l?i cho s?c kh?e.  N?u kh? n?ng nu?t c?a qu v? b? ?nh h??ng b?i ??t qu?, qu v? c th? c?n th?c hi?n cc b??c ?? trnh b? ngh?t s?c, ch?ng h?n nh?: ? ?n cc mi?ng nh? khi ?n. ? ?n cc th?c ?n m?m ho?c ???c nghi?n nh?Marland Kitchen An ton  [employer] ch? d?n c?a nhm chuyn gia ch?m [place] s?c kh?e c?a qu v? v? ho?t ??ng th? ch?t.  S? d?ng khung t?p ?i ho?c g?y ch?ng theo ch? d?n c?a chuyn gia ch?m [place] s?c kh?e.  Th?c hi?n cc b??c ?? t?o ra m?t mi tr??ng nh ? an ton nh?m gi?m nguy c? b? t ng. Vi?c ny c th? bao g?m: ? Nh? cc chuyn gia ki?m tra nh cho qu v?. ? L?p cc thanh v?n trong phng ng? v phng t?m. ? S? d?ng thi?t b? an ton, ch?ng h?n toilet ???c lm cao v gh? ng?i trong phng t?m. H??ng d?n chung  Khng s? d?ng b?t k? s?n ph?m thu?c l no, ch?ng ha?n nh? thu?c l d?ng ht, thu?c l d?ng nhai v thu?c l ?i?n t?. N?u qu v? c?n gip ?? ?? cai thu?c, hy h?i chuyn gia ch?m [place] s?c kh?e.  Gi?i h?n l??ng r??u qu v? u?ng khng qu 1 ly m?i ngy v?i ph? n? khng mang thai v 2 ly m?i ngy v?i nam gi?i. M?t ly t??ng ???ng v?i 12 ao-x? bia, 5 ao-x? r??u vang, ho?c 1 ao-x? r??u m?nh.  N?u qu v? c?n gip ?? ?? ch?m d?t s? d?ng ma ty ho?c r??u, hy yu c?u chuyn gia ch?m [place] s?c kh?e c?a qu v? vi?t gi?y gi?i thi?u ??n m?t ch??ng trnh ho?c m?t chuyn gia.  Duy tr l?i s?ng tch c?c v lnh m?nh. T?p th? d?c th??ng xuyn  theo ch? d?n c?a chuyn gia ch?m [place] s?c kh?e.  Tun th? t?t c? cc cu?c h?n  khm l?i theo ch? d?n c?a chuyn gia ch?m [place] s?c kh?e, bao g?m cc cu?c h?n khm v?i t?t c? cc chuyn gia trong nhm chuyn gia ch?m [place] s?c kh?e c?a qu v?. ?i?u ny c vai tr quan tr?ng. Ng?n ng?a tnh tr?ng ny b?ng cch no? Nguy c? b? m?t c?n ??t qu? khc c th? gi?m [place] Holian?ng b?ng cch ki?m sot huy?t p cao, cholesterol cao, ti?u ???ng, b?nh tim, ng?ng th? khi ng? v bo ph. C?ng c th? gi?m nguy c? b?ng cch b? ht thu?c l, h?n ch? r??u v duy tr ho?t ??ng th? ch?t. Chuyn gia ch?m [place] s?c kh?e c?a quy? vi? s? ti?p t?c trao ??i v?i qu v? v? cc bi?n php ?? ng?n ng?a cc bi?n ch?ng ng?n h?n v di h?n c?a ??t qu?Tasia Catchings c?u tr? gip ngay l?p t?c n?u:   Qu v? c b?t k? tri?u ch?ng ??t qu? no. "BE FAST" l cch d? dng ?? ghi nh? cc d?u hi?u chnh c?nh bo ??t qu?: ? B - Th?ng b?ng. Cc d?u hi?u l chng m?t, ??t ng?t ?i l?i kh kh?n ho?c m?t th?ng b?ng. ? E - M?t. Cc d?u hi?u ny bao g?m kh nhn ho?c ??t ng?t thay ??i th? l?c. ? F - M?t. Cc d?u hi?u l ??t ng?t y?u ho?c t ? m?t, ho?c m?t ho?c mi m?t ch?y x? ? m?t bn. ? A - Cnh tay. Cc d?u hi?u l y?u ho?c t ? m?t cnh tay. ?i?u ny x?y ra ??t ng?t v th??ng ? m?t bn c? th?. ? S - L?i ni. Cc d?u hi?u l ??t ng?t kh ni, ni nh?u ho?c kh hi?u ng??i khc ni g. ? T = Th?i gian. Th?i ?i?m c?n g?i d?ch v? c?p c?u. Ghi l?i th?i gian cc tri?u ch?ng b?t ??u.  Qu v? c cc d?u hi?u khc c?a ??t qu?, ch?ng h?n nh?: ? ?au ??u d? d?i, ??t ng?t khng r nguyn nhn. ? Bu?n nn ho?c nn. ? Co gi?t.  Nh?ng tri?u ch?ng ny c th? l bi?u hi?n c?a m?t v?n ?? nghim tr?ng c?n c?p c?u. Khng ch? xem tri?u ch?ng c h?t khng. Hy ?i khm ngay l?p t?c. G?i cho d?ch v? c?p c?u t?i ??a ph??ng (911 ? [place] Khng t? li xe ??n b?nh vi?n. Tm t?t  ??t qu? do thi?u mu no c?c b? (tai bi?n m?ch mu no, hay CVA) l s? ch?t ??t ng?t c?a m no x?y ra khi m?t vng  no khng nh?n ?? -xi.  Nh?ng tri?u ch?ng c?a tnh tr?ng ny th??ng pht tri?n ??t ng?t, ho?c quy? vi? c th? nh?n th?y cc tri?u ch?ng ? sau khi ngu? d?y.  M?t ?i?u r?t quan tr?ng la? pha?i ?i?u tr? ngay khi co? d?u hi?u ??u tin c?a ca?c tri?u ch?ng ??t qu?. ??t qu? tr??ng h?p c?p c?u y khoa ph?i ???c ?i?u tr? ngay l?p t?c. Thng tin ny khng nh?m m?c ?ch thay th? cho l?i khuyn m chuyn gia ch?m [place] s?c kh?e ni v?i qu v?. Hy b?o ??m qu v? ph?i th?o lu?n b?t k? v?n ?? g m qu v? c v?i chuyn gia ch?m [place] s?c kh?e c?a qu v?. Document Revised: 07/04/2017 Document Reviewed: 12/16/2015 [employer] Patient Education  [place].   Ph?c h?i ch?c n?ng sau ??t qu?, Ng??i l?n Rehabilitation After a Stroke, Adult ??t qu? gy ra t?n th??ng cho cc t? bo no, vi?c ny c th? ?nh h??ng ??n  kh? n?ng ?i l?i, ni chuy?n, ho?c ghi nh? cc s? vi?c. Tc ??ng c?a ??t qu? ??n m?i ng??i l khc nhau v vi?c ph?c h?i c?ng nh? v?y. M?t s? ng??i c ???c s? ti?n tri?n trong vi ngy ??u tin sau ?i?u tr?Marland Kitchen Nh?ng ng??i khc c th? m?t hng tu?n ho?c lu h?n ?? c ???c ti?n tri?n. Ph?c h?i ch?c n?ng sau ??t qu? bao g?m cc ?i?u tr? khc nhau ?? gip qu v? ph?c h?i v ??y m?nh s? ??c l?p c?a qu v? sau ??t qu?Sander Nephew v? c th? khng lm ???c m?i vi?c qu v? ? lm tr??c khi b? ??t qu?, nh?ng qu v? c th? h?c ???c cch qu?n l l?i s?ng c?a mnh v cng ??c l?p cng t?t. Vi?c ph?c h?i ch?c n?ng s? b?t ??u ngay khi qu v? c th? tham gia sau khi b? ??t qu? v vi?c ? lin quan ??n s? ch?m [place] c?a m?t nhm, c th? bao g?m:  Cassell Smiles ?nh v b?n b. Nh?ng ng??i thn yu c?a qu v? hi?u r qu v? nh?t v c th? gip ch r?t nhi?u cho vi?c ph?c h?i c?a qu v?.  Bc s?.  ?i?u d??ng vin.  Bc s? tr? li?u v?t l v ngh? nghi?p.  Chuyn gia tr? li?u ngn ng?Paulino Rily gia dinh d??ng.  Nh tm l h?c.  Nhn vin x h?i. Duy tr trao ??i c?i m? v?i t?t c? cc thnh vin trong nhm ch?m [place] c?a qu v?. Chia s?  h? s? s?c kh?e c?a qu v? n?u c?n thi?t v ghi l?i l?i khuyn c?a t?ng chuyn gia ch?m . V?t l tr? li?u l g? Cc chuyn gia v?t l tr? li?u (Physical therapist, PT) s? gip qu v? c?i thi?n s? ph?i h?p, th?ng b?ng v s?c m?nh c? c?a qu v?. V?t l tr? li?u c th? bao g?m:  Cc bi t?p v? bin ?? v?n ??ng.  Gip di chuy?n gi?a cc t? th? n?m, ng?i v ??ng.  ?i b? c g?y ch?ng ho?c khung t?p ?i, n?u c?n.  Gip s? d?ng c?u thang b?. Li?u php ngh? nghi?p l g? Chuyn gia li?u php ngh? nghi?p (Occupational therapist, OT) gip qu v? ti xy d?ng kh? n?ng lm cc cng vi?c hng ngy, ch?ng h?n nh? ?nh r?ng, ?i t?m, ?n v m?c qu?n o. Li?u php ngh? nghi?p c?ng c th? c tc d?ng v?i:  Th? l?c. Qut hnh ?nh l m?t k? thu?t ???c dng ?? phng ng?a t ng.  Rn [place]?n tr nh? v nh?n bi?t. Li?u php ny bao g?m cc k? thu?t gi?i quy?t v?n ?? v h?c l?i cc vi?c nh? g?i ?i?n tho?i.  Cc c? ??ng c? chnh xc nh? ci khuy o hay nh?t nh?ng ?? v?t nh?. Li?u php ngn ng? l g? Chuyn gia tr? li?u ngn ng? s? gip qu v? giao ti?p. Sau ??t qu?, qu v? c th? g?p cc v?n ?? trong vi?c hi?u nh?ng g ng??i khc ni, ho?c qu v? c th? g?p v?n ?? v? vi?t, ni, ho?c tm ?ng t? cho nh?ng g qu v? mu?n di?n ??t. Qu v? c?ng c th? c?n li?u php ngn ng? n?u kh nu?t khi ?n v u?ng. V d? v? li?u php ngn ng?:  Cc k? thu?t t?ng c??ng cc c? ???c s? d?ng trong vi?c nu?t.  G?i tn ?? v?t ho?c m t? b?c tranh. Vi?c ny s? gip hu?n luy?n l?i no ?? nh?n di?n v  ghi nh? t?.  Cc bi t?p t?ng c??ng c? lin quan ??n vi?c ni chuy?n, ch?ng h?n nh? l??i v mi.  Cc bi t?p ?? hu?n luy?n l?i no trong vi?c hi?u nh?ng g qu v? ??c v nghe. Ti s? c?n tr? li?u bao lu m?t l?n? Tr? li?u s? b?t ??u ngay khi qu v? c th? tham gia, th??ng l trong vng vi ngy ??u tin sau ??t qu?Liam Rogers ??u, cc bu?i tr? li?u s? th??ng xuyn. V d?: qu v? c th? c?n tr? li?u 2-3 gi? m?i ngy vo h?u h?t cc ngy trong  tu?n, trong vi thng ??u tin. C??ng ?? s? ph? thu?c vo lo?i v m?c ?? n?ng c?a ??t qu?Sander Nephew v? c th? c?n tr? li?u trong vi thng. Tr? li?u c th? ???c th?c hi?n trong b?nh vi?n, t?i trung tm ph?c h?i ch?c n?ng, ho?c t?i nh qu v?. Tr? li?u c b?t c? tc d?ng ph? no khng? Tr? li?u l an ton v th??ng c th? ch?u ??ng ???c. Qu v? c th? c?m th?y m?t m?i v? tinh th?n v th? ch?t sau tr? li?u, ??t bi?t l trong vi tu?n ??u tin. Ngh? ng?i tr??c cc bu?i tr? li?u n?u qu v? c?n ph?i lm nh? v?y ?? c th? ??t ???c nhi?u l?i ch nh?t t? vi?c ph?c h?i ch?c n?ng c?a mnh. Tun th? nh?ng h??ng d?n ny ? nh:  N?u c th?, hy ?? gia ?nh v b?n b tham gia vo qu trnh ph?c h?i c?a qu v?. C m?t ng??i khc khuy?n khch qu v? s? mang l?i l?i ch.  Tun th? cc h??ng d?n c?a chuyn gia tr? li?u ngn ng?, chuyn gia dinh d??ng, ho?c chuyn gia ch?m [place] s?c kh?e v? nh?ng g qu v? c th? ?n v u?ng an ton. ?n th?c ?n c l?i cho s?c kh?e. N?u kh? n?ng nu?t c?a qu v? b? ?nh h??ng b?i ??t qu?, qu v? c th? c?n th?c hi?n cc b??c ?? trnh b? ngh?t s?c, ch?ng h?n nh?: ? ?n cc mi?ng nh? khi ?n. ? ?n cc th?c ?n m?m ho?c ???c nghi?n nh?. ? U?ng cc ?? l?ng ? ???c lm ??c.  Duy tr k?t n?i x h?i v t??ng tc v?i b?n b, gia ?nh v cc nhm c?ng ??ng. ?y l m?t ph?n quan tr?ng trong qu trnh ph?c h?i c?a qu v?. Nh?ng thch th?c v? giao ti?p v thch th?c v? th? ch?t c th? khi?n qu v? c?m th?y b? c l?p sau ??t qu?Marland Kitchen  Cn nh?c tham gia m?t nhm h? tr? cho php qu v? ni v? ?nh h??ng c?a ??t qu? ??n cu?c s?ng c?a mnh. Qu v? c th? ???c gi?i thi?u cho m?t nh tm l h?c ho?c t? v?n vin. Ph?c h?i v? c?m xc sau ??t qu? c?ng quan tr?ng nh? ph?c h?i v? th? ch?t.  Tun th? t?t c? cc l?n khm theo di theo ch? d?n c?a chuyn gia ch?m [place] s?c kh?e. ?i?u ny c vai tr quan tr?ng. Tm t?t  Ph?c h?i ch?c n?ng sau ??t qu? bao g?m cc ?i?u tr? khc nhau ?? gip qu v? ph?c h?i v ??y m?nh s? ??c l?p c?a  qu v? sau ??t qu?Marland Kitchen  Vi?c ph?c h?i ch?c n?ng s? b?t ??u ngay khi qu v? c th? tham gia sau khi b? ??t qu? v bao g?m vi?c ch?m  c?a m?t nhm chuyn gia.  C??ng ?? tr? li?u s? ph? thu?c vo lo?i  v m?c ?? n?ng c?a ??t qu?Sander Nephew v? c th? c?n tr? li?u trong vi thng. Thng tin ny khng nh?m m?c ?ch thay th? cho l?i khuyn m chuyn gia ch?m [place] s?c kh?e ni v?i qu v?. Hy b?o ??m qu v? ph?i th?o lu?n b?t k? v?n ?? g m qu v? c v?i chuyn gia ch?m [place] s?c kh?e c?a qu v?. Document Revised: 01/05/2017 Elsevier Patient Education  [employer].

## 2020-11-02 ENCOUNTER — Ambulatory Visit (INDEPENDENT_AMBULATORY_CARE_PROVIDER_SITE_OTHER): Payer: Medicare Other | Admitting: Internal Medicine

## 2020-11-02 ENCOUNTER — Encounter: Payer: Self-pay | Admitting: Internal Medicine

## 2020-11-02 ENCOUNTER — Other Ambulatory Visit: Payer: Self-pay

## 2020-11-02 VITALS — BP 132/78 | HR 79 | Ht 61.0 in | Wt 113.4 lb

## 2020-11-02 DIAGNOSIS — I639 Cerebral infarction, unspecified: Secondary | ICD-10-CM

## 2020-11-02 HISTORY — PX: OTHER SURGICAL HISTORY: SHX169

## 2020-11-02 NOTE — Patient Instructions (Signed)
Medication Instructions:  Your physician recommends that you continue on your current medications as directed. Please refer to the Current Medication list given to you today.  *If you need a refill on your cardiac medications before your next appointment, please call your pharmacy*   Lab Work: None ordered If you have labs (blood work) drawn today and your tests are completely normal, you will receive your results only by: Marland Kitchen MyChart Message (if you have MyChart) OR . A paper copy in the mail If you have any lab test that is abnormal or we need to change your treatment, we will call you to review the results.   Testing/Procedures: None ordered   Follow-Up:   As needed

## 2020-11-02 NOTE — Progress Notes (Signed)
Electrophysiology Office Note   Date:  11/02/2020   ID:  Kelly [name], DOB 17-Nov-1933, MRN [id number]  PCP:  Patient, No Pcp Per    Primary Electrophysiologist: Hillis Range, MD    CC: stroke   History of Present Illness: Kelly [name] is a 87 y.o. female who presents today for electrophysiology evaluation.   She is referred by Dr Roda Shutters for Ep evaluation for possible arrhythmogenic cause for stroke. The patient presented with stroke 09/29/20.  She was found to have L basal ganglia and L corona radiata infarcts which were felt to be embolic.  The patient was observed on telemetry monitoring without arrhythmias observed.  Echo was unrevealing.  Today, she denies symptoms of palpitations, chest pain, shortness of breath, orthopnea, PND, lower extremity edema, claudication, dizziness, presyncope, syncope, bleeding, or neurologic sequela. The patient is tolerating medications without difficulties and is otherwise without complaint today.    Past Medical History:  Diagnosis Date  . GERD (gastroesophageal reflux disease)    History reviewed. No pertinent surgical history.   Current Outpatient Medications  Medication Sig Dispense Refill  . aspirin EC 325 MG EC tablet Take 1 tablet (325 mg total) by mouth daily. 30 tablet 11  . atorvastatin (LIPITOR) 40 MG tablet Take 1 tablet (40 mg total) by mouth daily. 30 tablet 2  . clopidogrel (PLAVIX) 75 MG tablet Take 1 tablet (75 mg total) by mouth daily. 30 tablet 2  . pantoprazole (PROTONIX) 40 MG tablet TAKE ONE TABLET TWICE DAILY 30 MINUTES BEFORE AM/PM MEALS     No current facility-administered medications for this visit.    Allergies:   Patient has no known allergies.   Social History:  The patient  reports that she has never smoked. She has never used smokeless tobacco. She reports current alcohol use. She reports that she does not use drugs.   Family History:  + HTN   ROS:  History is from son (Care giver).  Patient is not able to  provide full ROS.   PHYSICAL EXAM: VS:  BP 132/78   Pulse 79   Ht 5\' 1"  (1.549 m)   Wt 113 lb 6.4 oz (51.4 kg)   SpO2 99%   BMI 21.43 kg/m  , BMI Body mass index is 21.43 kg/m. GEN: Well nourished, well developed, in no acute distress HEENT: normal Neck: no JVD, carotid bruits, or masses Cardiac: RRR; no murmurs, rubs, or gallops,no edema  Respiratory:  clear to auscultation bilaterally, normal work of breathing GI: soft, nontender, nondistended, + BS MS: no deformity or atrophy Skin: warm and dry  Neuro:  Strength and sensation are intact Psych: euthymic mood, full affect  EKG:  EKG is ordered today. The ekg ordered today is personally reviewed and shows sinus rhythm with PR 236 msec   Recent Labs: 09/29/2020: ALT 16; BUN 11; Creatinine, Ser 0.81; Hemoglobin 13.2; Platelets 210; Potassium 3.8; Sodium 139  personally reviewed   Lipid Panel     Component Value Date/Time   CHOL 254 (H) 09/30/2020 1659   TRIG 167 (H) 09/30/2020 1659   HDL 41 09/30/2020 1659   CHOLHDL 6.2 09/30/2020 1659   VLDL 33 09/30/2020 1659   LDLCALC 180 (H) 09/30/2020 1659   personally reviewed   Wt Readings from Last 3 Encounters:  11/02/20 113 lb 6.4 oz (51.4 kg)  09/30/20 153 lb 11.2 oz (69.7 kg)     Other studies personally reviewed: Additional studies/ records that were reviewed today include: prior echo, BLE doppler, Dr  Xus notes  Review of the above records today demonstrates: as above  Assessment and Plan:  1. Cryptogenic stroke The patient presents with cryptogenic stroke.   I spoke at length with the patient and her son (care giver- translator) about monitoring for afib with an implantable loop recorder.  Risks, benefits, and alteratives to implantable loop recorder were discussed with the patient today.   At this time, the patient is very clear in their decision to proceed with implantable loop recorder.   Hillis Range MD, [facility] 11/02/2020 12:06 PM   PROCEDURES:   1.  Implantable loop recorder implantation     DESCRIPTION OF PROCEDURE:  Informed written consent was obtained.  The patient required no sedation for the procedure today.  The patients left chest was prepped and draped. Mapping over the patient's chest was performed to identify the appropriate ILR site.  This area was found to be the left parasternal region over the 3rd-4th intercostal space.  The skin overlying this region was infiltrated with lidocaine for local analgesia.  A 0.5-cm incision was made at the implant site.  A subcutaneous ILR pocket was fashioned using a combination of sharp and blunt dissection.  A Medtronic Reveal Linq model [id number] implantable loop recorder (SN [id number]) was then placed into the pocket R waves were very prominent and measured > 0.2 mV. EBL<1 ml.  Steri- Strips and a sterile dressing were then applied.  There were no early apparent complications.     CONCLUSIONS:   1. Successful implantation of a Medtronic Reveal LINQ implantable loop recorder for cryptogenic stroke  2. No early apparent complications.   Hillis Range MD, [facility] 11/02/2020 12:06 PM

## 2020-11-05 ENCOUNTER — Telehealth: Payer: Self-pay

## 2020-11-05 NOTE — Telephone Encounter (Signed)
ILR alert received 11/05/20 for new onset of AF. ILR implanted on 11/02/20. 3 new AF alerts received 11/04/20, longest in duration 50 minutes. Reviewed with Dr. Ladona Ridgel and would like to refer patient to AF Clinic for evaluation. Called and advised recommendations and reports patient has been asymptomatic. Advised to call with further questions or concerns.

## 2020-11-05 NOTE — Telephone Encounter (Signed)
Son requested appt for Monday. Appt made.

## 2020-11-09 ENCOUNTER — Ambulatory Visit (INDEPENDENT_AMBULATORY_CARE_PROVIDER_SITE_OTHER): Payer: Medicare Other | Admitting: Adult Health

## 2020-11-09 ENCOUNTER — Ambulatory Visit (HOSPITAL_COMMUNITY)
Admission: RE | Admit: 2020-11-09 | Discharge: 2020-11-09 | Disposition: A | Discharge status: Home / Self Care | Payer: Medicare Other | Source: Ambulatory Visit | Attending: Physician Assistant | Admitting: Physician Assistant

## 2020-11-09 ENCOUNTER — Encounter: Payer: Self-pay | Admitting: Adult Health

## 2020-11-09 ENCOUNTER — Other Ambulatory Visit: Payer: Self-pay

## 2020-11-09 VITALS — BP 135/76 | HR 65 | Ht 61.0 in | Wt 113.0 lb

## 2020-11-09 VITALS — BP 140/72 | HR 85 | Ht 61.0 in | Wt 113.4 lb

## 2020-11-09 DIAGNOSIS — I480 Paroxysmal atrial fibrillation: Secondary | ICD-10-CM | POA: Insufficient documentation

## 2020-11-09 DIAGNOSIS — Z7902 Long term (current) use of antithrombotics/antiplatelets: Secondary | ICD-10-CM | POA: Insufficient documentation

## 2020-11-09 DIAGNOSIS — Z7982 Long term (current) use of aspirin: Secondary | ICD-10-CM | POA: Diagnosis not present

## 2020-11-09 DIAGNOSIS — I639 Cerebral infarction, unspecified: Secondary | ICD-10-CM

## 2020-11-09 DIAGNOSIS — E785 Hyperlipidemia, unspecified: Secondary | ICD-10-CM

## 2020-11-09 DIAGNOSIS — Z7901 Long term (current) use of anticoagulants: Secondary | ICD-10-CM | POA: Diagnosis not present

## 2020-11-09 DIAGNOSIS — Z79899 Other long term (current) drug therapy: Secondary | ICD-10-CM | POA: Insufficient documentation

## 2020-11-09 DIAGNOSIS — Z95818 Presence of other cardiac implants and grafts: Secondary | ICD-10-CM | POA: Insufficient documentation

## 2020-11-09 DIAGNOSIS — I69319 Unspecified symptoms and signs involving cognitive functions following cerebral infarction: Secondary | ICD-10-CM

## 2020-11-09 DIAGNOSIS — D6869 Other thrombophilia: Secondary | ICD-10-CM | POA: Insufficient documentation

## 2020-11-09 DIAGNOSIS — I69351 Hemiplegia and hemiparesis following cerebral infarction affecting right dominant side: Secondary | ICD-10-CM

## 2020-11-09 DIAGNOSIS — Z8673 Personal history of transient ischemic attack (TIA), and cerebral infarction without residual deficits: Secondary | ICD-10-CM | POA: Insufficient documentation

## 2020-11-09 MED ORDER — APIXABAN 2.5 MG PO TABS: 2.5000 mg | ORAL_TABLET | Freq: Two times a day (BID) | ORAL | 3 refills | Status: DC

## 2020-11-09 NOTE — Patient Instructions (Signed)
Start Eliquis 2.5 mg twice a day

## 2020-11-09 NOTE — Patient Instructions (Signed)
Referral placed to home health for physical, occupation and speech therapy  Start Eliquis (apixaban) daily - once started, you can stop aspirin and plavix. continue atorvastatin  for secondary stroke prevention  We will check cholesterol levels today   Continue to follow up with PCP regarding cholesterol and blood pressure management  Maintain strict control of hypertension with blood pressure goal below 130/90 and cholesterol with LDL cholesterol (bad cholesterol) goal below 70 mg/dL.       Followup in the future with me in 3 months or call earlier if needed       Thank you for coming to see [place] at [facility]. I hope we have been able to provide you high quality care today.  You may receive a patient satisfaction survey over the next few weeks. We would appreciate your feedback and comments so that we may continue to improve ourselves and the health of our patients.

## 2020-11-09 NOTE — Progress Notes (Signed)
Primary Care Physician: Patient, No Pcp Per Primary Electrophysiologist: Dr Johney Frame Referring Physician: Dr Johney Frame   Kelly [name] is a 87 y.o. female with a history of prior CVA and atrial fibrillation who presents for follow up in the [facility]. The patient presented with stroke 09/29/20 with L basal ganglia and L corona radiata infarcts which were felt to be embolic. She had a ILR placed by Dr Johney Frame 11/02/20. The patient was initially diagnosed with atrial fibrillation on ILR 11/05/20 with 3 afib episodes, longest was 50 minutes. Patient has a CHADS2VASC score of 5. Interpreter used for today's visit. She denies any heart racing or palpitations.   Today, she denies symptoms of palpitations, chest pain, shortness of breath, orthopnea, PND, lower extremity edema, dizziness, presyncope, syncope, snoring, daytime somnolence, bleeding, or neurologic sequela. The patient is tolerating medications without difficulties and is otherwise without complaint today.    Atrial Fibrillation Risk Factors:  she does not have symptoms or diagnosis of sleep apnea. she does not have a history of rheumatic fever.   she has a BMI of Body mass index is 21.43 kg/m.Marland Kitchen Filed Weights   11/09/20 1408  Weight: 51.4 kg    No family history on file.   Atrial Fibrillation Management history:  Previous antiarrhythmic drugs: none Previous cardioversions: none Previous ablations: none CHADS2VASC score: 5 Anticoagulation history: none   Past Medical History:  Diagnosis Date  . GERD (gastroesophageal reflux disease)    Past Surgical History:  Procedure Laterality Date  . implantable loop recorder placement  11/02/2020   Medtronic Reveal Linq model [id number] implantable loop recorder (SN [id number]) implanted by Dr Johney Frame for cryptogenic stroke    Current Outpatient Medications  Medication Sig Dispense Refill  . apixaban (ELIQUIS) 2.5 MG TABS tablet Take 1 tablet (2.5 mg total) by  mouth 2 (two) times daily. 60 tablet 3  . aspirin EC 325 MG EC tablet Take 1 tablet (325 mg total) by mouth daily. 30 tablet 11  . atorvastatin (LIPITOR) 40 MG tablet Take 1 tablet (40 mg total) by mouth daily. 30 tablet 2  . clopidogrel (PLAVIX) 75 MG tablet Take 1 tablet (75 mg total) by mouth daily. 30 tablet 2  . pantoprazole (PROTONIX) 40 MG tablet TAKE ONE TABLET TWICE DAILY 30 MINUTES BEFORE AM/PM MEALS     No current facility-administered medications for this encounter.    No Known Allergies  Social History   Socioeconomic History  . Marital status: Widowed    Spouse name: Not on file  . Number of children: Not on file  . Years of education: Not on file  . Highest education level: Not on file  Occupational History  . Not on file  Tobacco Use  . Smoking status: Never Smoker  . Smokeless tobacco: Never Used  Substance and Sexual Activity  . Alcohol use: Yes  . Drug use: Never  . Sexual activity: Not on file  Other Topics Concern  . Not on file  Social History Narrative  . Not on file   Social Determinants of Health   Financial Resource Strain: Not on file  Food Insecurity: Not on file  Transportation Needs: Not on file  Physical Activity: Not on file  Stress: Not on file  Social Connections: Not on file  Intimate Partner Violence: Not on file     ROS- All systems are reviewed and negative except as per the HPI above.  Physical Exam: Vitals:   11/09/20 1408  BP: 140/72  Pulse: 85  Weight: 51.4 kg  Height: 5\' 1"  (1.549 m)    GEN- The patient is well appearing elderly female, alert and oriented x 3 today.   Head- normocephalic, atraumatic Eyes-  Sclera clear, conjunctiva pink Ears- hearing intact Oropharynx- clear Neck- supple  Lungs- Clear to ausculation bilaterally, normal work of breathing Heart- Regular rate and rhythm, no murmurs, rubs or gallops  GI- soft, NT, ND, + BS Extremities- no clubbing, cyanosis, or edema MS- no significant deformity  or atrophy Skin- no rash or lesion Psych- euthymic mood, full affect Neuro- strength and sensation are intact  Wt Readings from Last 3 Encounters:  11/09/20 51.4 kg  11/02/20 51.4 kg  09/30/20 69.7 kg    EKG today demonstrates  SR, 1st degree AV block Vent. rate 85 BPM PR interval 222 ms QRS duration 86 ms QT/QTc 362/430 ms  Echo 10/01/20 demonstrated  1. Left ventricular ejection fraction, by estimation, is 60 to 65%. The  left ventricle has normal function. The left ventricle has no regional  wall motion abnormalities. Left ventricular diastolic parameters are  indeterminate.  2. Right ventricular systolic function is normal. The right ventricular  size is normal. There is normal pulmonary artery systolic pressure.  3. The mitral valve is normal in structure. No evidence of mitral valve  regurgitation. No evidence of mitral stenosis.  4. The aortic valve is normal in structure. Aortic valve regurgitation is  not visualized. No aortic stenosis is present.  5. The inferior vena cava is normal in size with greater than 50%  respiratory variability, suggesting right atrial pressure of 3 mmHg.   Conclusion(s)/Recommendation(s): No intracardiac source of embolism  detected on this transthoracic study. A transesophageal echocardiogram is  recommended to exclude cardiac source of embolism if clinically indicated.  Epic records are reviewed at length today  CHA2DS2-VASc Score = 5  The patient's score is based upon: CHF History: No HTN History: No Diabetes History: No Stroke History: Yes Vascular Disease History: No Age Score: 2 Gender Score: 1      ASSESSMENT AND PLAN: 1. Paroxysmal Atrial Fibrillation (ICD10:  I48.0) The patient's CHA2DS2-VASc score is 5, indicating a 7.2% annual risk of stroke.   General education about afib provided and questions answered. We also discussed her stroke risk and the risks and benefits of anticoagulation. Will start Eliquis 2.5 mg  BID (age, weight) Patient to see neurology later today, will ask for their input on antiplatelet therapy. Suspect will discontinue one or both ASA and Plavix. Recent lab work reviewed.   2. Secondary Hypercoagulable State (ICD10:  D68.69) The patient is at significant risk for stroke/thromboembolism based upon her CHA2DS2-VASc Score of 5.  Start Apixaban (Eliquis).     Follow up in the AF clinic in one month.    10/03/20 PA-C Afib Clinic [facility] [address] [place], [place] [phone] 11/09/2020 2:50 PM

## 2020-11-09 NOTE — Progress Notes (Signed)
[facility] [address] [place]. [place] [address] [phone]       HOSPITAL FOLLOW UP NOTE  Ms. Kelly [name] Date of Birth:  08/17/1934 Medical Record Number:  [id number]   Reason for Referral:  hospital stroke follow up    SUBJECTIVE:   CHIEF COMPLAINT:  Chief Complaint  Patient presents with  . Hospitalization Follow-up    Treatment room; stroke follow-up; son states she has been doing ok, denies any lasting effects. Plan is to start Eliquis 2.5 mg BID. Pt is on Aspirin and Plavix (almost out of plavix). Son states the doctor was going to clear with neurology. Son asks if she needs to take both medications or just one. Here with interpreter.    HPI:   Ms. Kelly [name] is a 87 y.o. female with history of GERD  who presented to [facility] ED on 11/09/2020 with 5 day history of right side weakness and facial droop.  Personally reviewed hospitalization pertinent progress notes, lab work and imaging with summary provided.  Evaluated by Dr. Roda Shutters with stroke work up revealed L MCA stroke insetting of left MCA M1 occlusion as well as mid BA and left P1/P2 moderate stenosis.  Although evidence of multifocal intracranial stenosis, stroke concerning for cardioembolic source therefore recommended placement of loop recorder to further assess for possible atrial fibrillation with plans on completing outpatient.  Recommended DAPT for 3 months given intracranial stenosis then aspirin alone.  HTN stable.  LDL 180 and initiate atorvastatin 40 mg daily.  DM controlled with A1c 6.0.  Other stroke risk factors include advanced age but no prior stroke history.  Evaluated by therapies and recommended [facility] PT/OT and discharged home in stable condition.  Stroke:  Left BG and left corona radiata infarct embolic   CTA head & neck 1. 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 Critical Value/emergent results were called by telephone at the time of interpretation on  09/29/2020 at 7:16 pm to provider DAVID YAO , who verbally acknowledged these results. 2. 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 3. Moderate left PCA P1 P2 junction stenosis.  MRI /MRA  1. Acute/subacute infarcts involving the left basal ganglia and corona radiata with additional focus of restricted diffusion in the left precentral gyrus. 2. Occlusion of the proximal left M1/MCA, as seen on prior CT angiogram. 3. Short segment occlusion of the proximal right M2/MCA posterior division branch. 4. Luminal irregularity of the bilateral posterior cerebral arteries, consistent with intracranial atherosclerotic disease with moderate stenosis at the left P1-P2/PCA junction. 5. Moderate stenosis at the terminus of the right internal carotid Artery.  2D Echo No intracardiac source of embolism identified  LDL 180  HgbA1c 6.0  VTE prophylaxis - Lovenox  No antithrombotic prior to admission, now on aspirin 81 mg daily and clopidogrel 75 mg daily.   Therapy recommendations:  HH PT/OT  Disposition:  home   Today, 11/09/2020, Ms. Chambers is being seen for hospital follow-up accompanied by her son and interpreter.  Stable since discharge with residual RUE weakness and mild cognitive impairment.  Does report gradual improvement but has not been seen by any HH therapies since discharge.  Denies residual speech or language impairment or dysarthria.  Denies new stroke/TIA symptoms.  Evaluated by cardiology on 11/02/2020 with placement of loop recorder by Dr. Johney Frame. ILR alerted on 11/05/2020 with new onset AF -evaluated by cardiology today and recommended initiating Eliquis 2.5 mg twice daily (age and weight).  Ongoing use  of DAPT with duration and indication deferred to our office.  She has remained on atorvastatin 40 mg daily without myalgias.  Blood pressure today 135/76.  No further concerns at this time.       ROS:   14 system review of systems performed and negative with  exception of those listed in HPI  PMH:  Past Medical History:  Diagnosis Date  . GERD (gastroesophageal reflux disease)     PSH:  Past Surgical History:  Procedure Laterality Date  . COLON SURGERY  2012  . implantable loop recorder placement  11/02/2020   Medtronic Reveal Linq model [id number] implantable loop recorder (SN [id number]) implanted by Dr Johney Frame for cryptogenic stroke    Social History:  Social History   Socioeconomic History  . Marital status: Widowed    Spouse name: Not on file  . Number of children: 7  . Years of education: Not on file  . Highest education level: Not on file  Occupational History  . Not on file  Tobacco Use  . Smoking status: Never Smoker  . Smokeless tobacco: Never Used  Substance and Sexual Activity  . Alcohol use: Not Currently    Comment: was a little bit of red wine   . Drug use: Never  . Sexual activity: Not on file  Other Topics Concern  . Not on file  Social History Narrative   Lives with her youngest son   Right handed   Caffeine: 1 cup coffee every 2-3 days   Social Determinants of Health   Financial Resource Strain: Not on file  Food Insecurity: Not on file  Transportation Needs: Not on file  Physical Activity: Not on file  Stress: Not on file  Social Connections: Not on file  Intimate Partner Violence: Not on file    Family History:  Family History  Problem Relation Age of Onset  . Stroke Neg Hx     Medications:   Current Outpatient Medications on File Prior to Visit  Medication Sig Dispense Refill  . aspirin EC 325 MG EC tablet Take 1 tablet (325 mg total) by mouth daily. 30 tablet 11  . atorvastatin (LIPITOR) 40 MG tablet Take 1 tablet (40 mg total) by mouth daily. 30 tablet 2  . clopidogrel (PLAVIX) 75 MG tablet Take 1 tablet (75 mg total) by mouth daily. 30 tablet 2  . pantoprazole (PROTONIX) 40 MG tablet TAKE ONE TABLET TWICE DAILY 30 MINUTES BEFORE AM/PM MEALS     No current facility-administered  medications on file prior to visit.    Allergies:  No Known Allergies    OBJECTIVE:  Physical Exam  Vitals:   11/09/20 1522  BP: 135/76  Pulse: 65  Weight: 113 lb (51.3 kg)  Height: 5\' 1"  (1.549 m)   Body mass index is 21.35 kg/m. No exam data present  No flowsheet data found.   General: well developed, well nourished,  frail pleasant elderly female, seated, in no evident distress Head: head normocephalic and atraumatic.   Neck: supple with no carotid or supraclavicular bruits Cardiovascular: regular rate and rhythm, no murmurs Musculoskeletal: no deformity Skin:  no rash/petichiae Vascular:  Normal pulses all extremities   Neurologic Exam Mental Status: Awake and fully alert.   Primary language of but denies dysarthria or aphasia.  Oriented to place and time. Recent memory subjectively impaired and remote memory intact. Attention span, concentration and fund of knowledge impaired with some providing majority of history. Mood and affect appropriate.  Cranial Nerves: Pupils  equal, briskly reactive to light. Extraocular movements full without nystagmus. Visual fields full to confrontation. Hearing intact. Facial sensation intact. Face, tongue, palate moves normally and symmetrically.  Motor: Normal bulk and tone and strength left upper and lower extremity. RUE: 3-4/5 with decreased grip strength; RLE: 5/5 Sensory.: intact to touch , pinprick , position and vibratory sensation.  Coordination: Rapid alternating movements decreased right hand. Finger-to-nose performed accurately LUE and heel-to-shin performed accurately bilaterally. Gait and Station: Arises from chair without difficulty. Stance is normal. Gait demonstrates normal stride length and balance without use of assistive device.  Able to tandem walk and heel toe without difficulty. Reflexes: 1+ and symmetric. Toes downgoing.     NIHSS  0 Modified Rankin  3      ASSESSMENT: Kelly [name] is a 87 y.o. year  old female presented on 09/21/2020 with 5 day hx of right-sided weakness and facial droop with stroke work-up revealing L BG and CR infarct, embolic secondary to unknown source although evidence of multifocal intracranial stenosis and occlusions (L MCA M1 occlusion, mid BA and L P1/P2 moderate stenosis).  Recommended placement of loop recorder outpatient to further assess for atrial fibrillation.  Vascular risk factors include HTN, HLD, pre-DM and advanced age.      PLAN:  1. L BG and CR stroke :  a. Residual deficit: RUE weakness and cognitive impairment.  Referral placed to [facility]/OT/SLP as advised at hospital discharge b. Recorder placed 11/02/2020 with evidence of atrial fibrillation on 2/3 -cardiology OV today and recommended initiating Eliquis 2.5 mg twice daily (age and weight) for secondary stroke prevention.  Advised son that aspirin and Plavix can be discontinued after initiating Eliquis as no indication from a stroke perspective for continued aspirin and Plavix c. Continue atorvastatin 40 mg daily for secondary stroke prevention.   d. Discussed secondary stroke prevention measures and importance of close PCP follow up for aggressive stroke risk factor management  2. Atrial fibrillation, new dx: Evidence on ILR -CHA2DS2-VASc score of at least 6 indicating use of AC -agree with cardiology recommendations of initiating Eliquis 2.5 mg twice daily (based on age and weight) 3. HTN: BP goal <130/90.  Stable on nonpharmacological management per PCP 4. HLD: LDL goal <70. Recent LDL 180 therefore atorvastatin 40 mg daily initiated -repeat lipid panel today.  5. Pre-DMII: A1c goal<7.0. Recent A1c 6.0.  Currently on nonpharmacological management monitored by PCP    Follow up in 3 months or call earlier if needed  CC:  [facility] provider: Dr. Anselm Jungling, [facility]    I spent 45 minutes of face-to-face and non-face-to-face time with patient and son assisted by interpreter.  This included  previsit chart review including recent hospitalization pertinent progress notes, lab work and imaging, lab review, study review, order entry, electronic health record documentation, patient education regarding recent stroke and new diagnosis of A. fib, residual deficits in initiating therapies, importance of managing stroke risk factors and answered all other questions to patient and sons satisfaction   Ihor Austin, AGNP-BC  [facility] [address] [place], [place] [address]  Phone [phone] Fax [phone] Note: This document was prepared with digital dictation and possible smart phrase technology. Any transcriptional errors that result from this process are unintentional.

## 2020-11-10 ENCOUNTER — Telehealth: Payer: Self-pay | Admitting: *Deleted

## 2020-11-10 ENCOUNTER — Encounter: Payer: Self-pay | Admitting: *Deleted

## 2020-11-10 LAB — LIPID PANEL
Chol/HDL Ratio: 3.5 ratio (ref 0.0–4.4)
Cholesterol, Total: 143 mg/dL (ref 100–199)
HDL: 41 mg/dL (ref >39)
LDL Chol Calc (NIH): 82 mg/dL (ref 0–99)
Triglycerides: 106 mg/dL (ref 0–149)
VLDL Cholesterol Cal: 20 mg/dL (ref 5–40)

## 2020-11-10 NOTE — Progress Notes (Signed)
I agree with the above plan 

## 2020-11-10 NOTE — Telephone Encounter (Signed)
Mailed lab results letter to pt.

## 2020-11-10 NOTE — Telephone Encounter (Signed)
-----   Message from Ihor Austin, NP sent at 11/10/2020  8:56 AM EST ----- Please advise patient/son that recent cholesterol levels showed improvement of LDL bad cholesterol 82.  Would encourage continuation of current regimen

## 2020-11-25 ENCOUNTER — Telehealth: Payer: Self-pay | Admitting: Adult Health

## 2020-11-25 NOTE — Telephone Encounter (Signed)
It would be fine.  Thank you.

## 2020-11-25 NOTE — Telephone Encounter (Signed)
Kelly [name] has accepted referral can start patient with 48 hours .   [name] is wanted to know if they can start PT and Speech . And start OT in  one week because of staffing .  Kelly [name] are you ok with this ? Please advise   Thanks Annabelle Harman

## 2020-11-25 NOTE — Telephone Encounter (Signed)
Up date Correction . [facility] only can do PT and Speech .  Per [facility] PT will evaluate for OT . [facility] does not have OT at all . Thanks Annabelle Harman Please advise ?

## 2020-11-26 NOTE — Telephone Encounter (Signed)
Updated note sent . To Home Health.

## 2020-12-07 ENCOUNTER — Ambulatory Visit (INDEPENDENT_AMBULATORY_CARE_PROVIDER_SITE_OTHER): Payer: Medicare Other

## 2020-12-07 ENCOUNTER — Other Ambulatory Visit: Payer: Self-pay

## 2020-12-07 ENCOUNTER — Encounter (HOSPITAL_COMMUNITY): Payer: Self-pay | Admitting: Physician Assistant

## 2020-12-07 ENCOUNTER — Ambulatory Visit (HOSPITAL_COMMUNITY)
Admission: RE | Admit: 2020-12-07 | Discharge: 2020-12-07 | Disposition: A | Discharge status: Home / Self Care | Payer: Medicare Other | Source: Ambulatory Visit | Attending: Physician Assistant | Admitting: Physician Assistant

## 2020-12-07 VITALS — BP 116/66 | HR 84 | Ht 61.0 in | Wt 112.6 lb

## 2020-12-07 DIAGNOSIS — Z7901 Long term (current) use of anticoagulants: Secondary | ICD-10-CM | POA: Diagnosis not present

## 2020-12-07 DIAGNOSIS — D6869 Other thrombophilia: Secondary | ICD-10-CM | POA: Diagnosis not present

## 2020-12-07 DIAGNOSIS — Z95818 Presence of other cardiac implants and grafts: Secondary | ICD-10-CM | POA: Insufficient documentation

## 2020-12-07 DIAGNOSIS — I63512 Cerebral infarction due to unspecified occlusion or stenosis of left middle cerebral artery: Secondary | ICD-10-CM

## 2020-12-07 DIAGNOSIS — Z79899 Other long term (current) drug therapy: Secondary | ICD-10-CM | POA: Insufficient documentation

## 2020-12-07 DIAGNOSIS — I480 Paroxysmal atrial fibrillation: Secondary | ICD-10-CM | POA: Diagnosis present

## 2020-12-07 DIAGNOSIS — Z8673 Personal history of transient ischemic attack (TIA), and cerebral infarction without residual deficits: Secondary | ICD-10-CM | POA: Diagnosis not present

## 2020-12-07 LAB — BASIC METABOLIC PANEL WITH GFR
Anion gap: 9 (ref 5–15)
BUN: 13 mg/dL (ref 8–23)
CO2: 24 mmol/L (ref 22–32)
Calcium: 8.9 mg/dL (ref 8.9–10.3)
Chloride: 106 mmol/L (ref 98–111)
Creatinine, Ser: 0.78 mg/dL (ref 0.44–1.00)
GFR, Estimated: >60 mL/min
Glucose, Bld: 114 mg/dL — ABNORMAL HIGH (ref 70–99)
Potassium: 3.9 mmol/L (ref 3.5–5.1)
Sodium: 139 mmol/L (ref 135–145)

## 2020-12-07 LAB — CBC
HCT: 38 % (ref 36.0–46.0)
Hemoglobin: 12.6 g/dL (ref 12.0–15.0)
MCH: 29.5 pg (ref 26.0–34.0)
MCHC: 33.2 g/dL (ref 30.0–36.0)
MCV: 89 fL (ref 80.0–100.0)
Platelets: 206 10*3/uL (ref 150–400)
RBC: 4.27 MIL/uL (ref 3.87–5.11)
RDW: 13.2 % (ref 11.5–15.5)
WBC: 4.1 10*3/uL (ref 4.0–10.5)
nRBC: 0 % (ref 0.0–0.2)

## 2020-12-07 MED ORDER — APIXABAN 2.5 MG PO TABS: 2.5000 mg | ORAL_TABLET | Freq: Two times a day (BID) | ORAL | 2 refills | Status: DC

## 2020-12-07 NOTE — Progress Notes (Signed)
Primary Care Physician: [facility] Primary Electrophysiologist: Dr Johney Frame Referring Physician: Dr Johney Frame   Kelly [name] is a 87 y.o. female with a history of prior CVA and atrial fibrillation who presents for follow up in the [facility]. The patient presented with stroke 09/29/20 with L basal ganglia and L corona radiata infarcts which were felt to be embolic. She had a ILR placed by Dr Johney Frame 11/02/20. The patient was initially diagnosed with atrial fibrillation on ILR 11/05/20 with 3 afib episodes, longest was 50 minutes. Patient has a CHADS2VASC score of 5.   Interpreter present for visit. On follow up today, patient reports she has done well since her last visit. ILR shows 0.2% afib burden. She denies any bleeding issues since starting anticoagulation.   Today, she denies symptoms of palpitations, chest pain, shortness of breath, orthopnea, PND, lower extremity edema, dizziness, presyncope, syncope, snoring, daytime somnolence, bleeding, or neurologic sequela. The patient is tolerating medications without difficulties and is otherwise without complaint today.    Atrial Fibrillation Risk Factors:  she does not have symptoms or diagnosis of sleep apnea. she does not have a history of rheumatic fever.   she has a BMI of Body mass index is 21.28 kg/m.Marland Kitchen Filed Weights   12/07/20 1111  Weight: 51.1 kg    Family History  Problem Relation Age of Onset  . Stroke Neg Hx      Atrial Fibrillation Management history:  Previous antiarrhythmic drugs: none Previous cardioversions: none Previous ablations: none CHADS2VASC score: 5 Anticoagulation history: Eliquis   Past Medical History:  Diagnosis Date  . GERD (gastroesophageal reflux disease)    Past Surgical History:  Procedure Laterality Date  . COLON SURGERY  2012  . implantable loop recorder placement  11/02/2020   Medtronic Reveal Linq model [id number] implantable loop recorder (SN  [id number]) implanted by Dr Johney Frame for cryptogenic stroke    Current Outpatient Medications  Medication Sig Dispense Refill  . apixaban (ELIQUIS) 2.5 MG TABS tablet Take 1 tablet (2.5 mg total) by mouth 2 (two) times daily. 60 tablet 3  . atorvastatin (LIPITOR) 40 MG tablet Take 1 tablet (40 mg total) by mouth daily. 30 tablet 2  . Multiple Vitamins-Minerals (MULTIVITAMIN ADULTS 50+) TABS Take by mouth.    . pantoprazole (PROTONIX) 40 MG tablet TAKE ONE TABLET TWICE DAILY 30 MINUTES BEFORE AM/PM MEALS     No current facility-administered medications for this encounter.    No Known Allergies  Social History   Socioeconomic History  . Marital status: Widowed    Spouse name: Not on file  . Number of children: 7  . Years of education: Not on file  . Highest education level: Not on file  Occupational History  . Not on file  Tobacco Use  . Smoking status: Never Smoker  . Smokeless tobacco: Never Used  Substance and Sexual Activity  . Alcohol use: Not Currently    Comment: was a little bit of red wine   . Drug use: Never  . Sexual activity: Not on file  Other Topics Concern  . Not on file  Social History Narrative   Lives with her youngest son   Right handed   Caffeine: 1 cup coffee every 2-3 days   Social Determinants of Health   Financial Resource Strain: Not on file  Food Insecurity: Not on file  Transportation Needs: Not on file  Physical Activity: Not on file  Stress: Not on file  Social Connections: Not  on file  Intimate Partner Violence: Not on file     ROS- All systems are reviewed and negative except as per the HPI above.  Physical Exam: Vitals:   12/07/20 1111  BP: 116/66  Pulse: 84  Weight: 51.1 kg  Height: 5\' 1"  (1.549 m)    GEN- The patient is a well appearing elderly female, alert and oriented x 3 today.   HEENT-head normocephalic, atraumatic, sclera clear, conjunctiva pink, hearing intact, trachea midline. Lungs- Clear to ausculation  bilaterally, normal work of breathing Heart- Regular rate and rhythm, no murmurs, rubs or gallops  GI- soft, NT, ND, + BS Extremities- no clubbing, cyanosis, or edema MS- no significant deformity or atrophy Skin- no rash or lesion Psych- euthymic mood, full affect Neuro- strength and sensation are intact   Wt Readings from Last 3 Encounters:  12/07/20 51.1 kg  11/09/20 51.4 kg  11/09/20 51.3 kg    EKG today demonstrates  SR, 1st degree AV block Vent. rate 84 BPM PR interval 218 ms QRS duration 74 ms QT/QTc 386/456 ms  Echo 10/01/20 demonstrated  1. Left ventricular ejection fraction, by estimation, is 60 to 65%. The  left ventricle has normal function. The left ventricle has no regional  wall motion abnormalities. Left ventricular diastolic parameters are  indeterminate.  2. Right ventricular systolic function is normal. The right ventricular  size is normal. There is normal pulmonary artery systolic pressure.  3. The mitral valve is normal in structure. No evidence of mitral valve  regurgitation. No evidence of mitral stenosis.  4. The aortic valve is normal in structure. Aortic valve regurgitation is  not visualized. No aortic stenosis is present.  5. The inferior vena cava is normal in size with greater than 50%  respiratory variability, suggesting right atrial pressure of 3 mmHg.   Conclusion(s)/Recommendation(s): No intracardiac source of embolism  detected on this transthoracic study. A transesophageal echocardiogram is  recommended to exclude cardiac source of embolism if clinically indicated.  Epic records are reviewed at length today  CHA2DS2-VASc Score = 5  The patient's score is based upon: CHF History: No HTN History: No Diabetes History: No Stroke History: Yes Vascular Disease History: No Age Score: 2 Gender Score: 1      ASSESSMENT AND PLAN: 1. Paroxysmal Atrial Fibrillation (ICD10:  I48.0) The patient's CHA2DS2-VASc score is 5, indicating a  7.2% annual risk of stroke.   ILR shows 0.2% afib burden. She is unaware of her arrhythmia.  Continue Eliquis 2.5 mg BID (age, weight) Check bmet/CBC today.  2. Secondary Hypercoagulable State (ICD10:  D68.69) The patient is at significant risk for stroke/thromboembolism based upon her CHA2DS2-VASc Score of 5.  Continue Apixaban (Eliquis).    Follow up in the AF clinic in 3 months.    10/03/20 PA-C Afib Clinic [facility] [address] [place], [place] [phone] 12/07/2020 11:20 AM

## 2020-12-08 LAB — CUP PACEART REMOTE DEVICE CHECK
Date Time Interrogation Session: 20220305205644
Implantable Pulse Generator Implant Date: 20220131

## 2020-12-16 NOTE — Progress Notes (Signed)
[facility] Summary Report / Loop Recorder 

## 2021-01-07 ENCOUNTER — Ambulatory Visit (INDEPENDENT_AMBULATORY_CARE_PROVIDER_SITE_OTHER): Payer: Medicare Other

## 2021-01-07 DIAGNOSIS — I63512 Cerebral infarction due to unspecified occlusion or stenosis of left middle cerebral artery: Secondary | ICD-10-CM | POA: Diagnosis not present

## 2021-01-11 LAB — CUP PACEART REMOTE DEVICE CHECK
Date Time Interrogation Session: 20220407112257
Implantable Pulse Generator Implant Date: 20220131

## 2021-01-20 NOTE — Progress Notes (Signed)
[facility] Summary Report / Loop Recorder 

## 2021-02-08 ENCOUNTER — Encounter: Payer: Self-pay | Admitting: Adult Health

## 2021-02-08 ENCOUNTER — Other Ambulatory Visit: Payer: Self-pay

## 2021-02-08 ENCOUNTER — Ambulatory Visit (INDEPENDENT_AMBULATORY_CARE_PROVIDER_SITE_OTHER): Payer: Medicare Other

## 2021-02-08 ENCOUNTER — Ambulatory Visit (INDEPENDENT_AMBULATORY_CARE_PROVIDER_SITE_OTHER): Payer: Medicare Other | Admitting: Adult Health

## 2021-02-08 VITALS — BP 122/70 | HR 51 | Ht 61.0 in | Wt 112.0 lb

## 2021-02-08 DIAGNOSIS — I639 Cerebral infarction, unspecified: Secondary | ICD-10-CM

## 2021-02-08 DIAGNOSIS — I63512 Cerebral infarction due to unspecified occlusion or stenosis of left middle cerebral artery: Secondary | ICD-10-CM

## 2021-02-08 DIAGNOSIS — E785 Hyperlipidemia, unspecified: Secondary | ICD-10-CM

## 2021-02-08 DIAGNOSIS — I10 Essential (primary) hypertension: Secondary | ICD-10-CM | POA: Diagnosis not present

## 2021-02-08 DIAGNOSIS — I480 Paroxysmal atrial fibrillation: Secondary | ICD-10-CM

## 2021-02-08 MED ORDER — ATORVASTATIN CALCIUM 40 MG PO TABS: 40.0000 mg | ORAL_TABLET | Freq: Every day | ORAL | 3 refills | Status: DC

## 2021-02-08 NOTE — Progress Notes (Signed)
[facility] [address] [place]. [place] [address] [phone]       STROKE FOLLOW UP NOTE  Ms. Kelly [name] Date of Birth:  27-Oct-1933 Medical Record Number:  [id number]   Reason for Referral: stroke follow up    SUBJECTIVE:   CHIEF COMPLAINT:  Chief Complaint  Patient presents with  . Follow-up    RM 14 with tru Zenaida Niece (son) (& [facility] interpreter ) Pt is well, walking much better. No new symptoms     HPI:   Today, 02/08/2021, Kelly [name] returns for stroke follow-up after prior visit 3 months ago accompanied by son and [facility] interpreter.   Denies new stroke/TIA symptoms with residual mild RUE weakness but overall greatly improving.  Continues to live with son who has been assisting with daily exercise and activity.  Cognition has improved and currently at baseline.  Previously ordered HH therapies but son declined after visit d/t COVID concerns  Remains on Eliquis without associated side effects Apparently has been out of refills of atorvastatin over the past couple of months - has not had recent follow-up with PCP due to COVID-19 concerns and limiting patients exposure Blood pressure today 122/70  No further concerns at this time    History provided for reference purposes only Initial visit 11/09/2020 JM: Kelly [name] is being seen for hospital follow-up accompanied by her son and interpreter.  Stable since discharge with residual RUE weakness and mild cognitive impairment.  Does report gradual improvement but has not been seen by any HH therapies since discharge.  Denies residual speech or language impairment or dysarthria.  Denies new stroke/TIA symptoms.  Evaluated by cardiology on 11/02/2020 with placement of loop recorder by Dr. Johney Frame. ILR alerted on 11/05/2020 with new onset AF -evaluated by cardiology today and recommended initiating Eliquis 2.5 mg twice daily (age and weight).  Ongoing use of DAPT with duration and indication deferred to our office.  She  has remained on atorvastatin 40 mg daily without myalgias.  Blood pressure today 135/76.  No further concerns at this time.  Stroke admission 09/29/2020 Ms. Kelly [name] is a 87 y.o. female with history of GERD  who presented to [facility] ED on 09/29/2020 with 5 day history of right side weakness and facial droop.  Personally reviewed hospitalization pertinent progress notes, lab work and imaging with summary provided.  Evaluated by Dr. Roda Shutters with stroke work up revealed L MCA stroke insetting of left MCA M1 occlusion as well as mid BA and left P1/P2 moderate stenosis.  Although evidence of multifocal intracranial stenosis, stroke concerning for cardioembolic source therefore recommended placement of loop recorder to further assess for possible atrial fibrillation with plans on completing outpatient.  Recommended DAPT for 3 months given intracranial stenosis then aspirin alone.  HTN stable.  LDL 180 and initiate atorvastatin 40 mg daily.  DM controlled with A1c 6.0.  Other stroke risk factors include advanced age but no prior stroke history.  Evaluated by therapies and recommended [facility] PT/OT and discharged home in stable condition.  Stroke:  Left BG and left corona radiata infarct embolic   CTA head & neck 1. 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 Critical Value/emergent results were called by telephone at the time of interpretation on 09/29/2020 at 7:16 pm to provider DAVID YAO , who verbally acknowledged these results. 2. 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 3. Moderate left PCA P1 P2 junction stenosis.  MRI /MRA  1. Acute/subacute infarcts  involving the left basal ganglia and corona radiata with additional focus of restricted diffusion in the left precentral gyrus. 2. Occlusion of the proximal left M1/MCA, as seen on prior CT angiogram. 3. Short segment occlusion of the proximal right M2/MCA posterior division branch. 4. Luminal irregularity of  the bilateral posterior cerebral arteries, consistent with intracranial atherosclerotic disease with moderate stenosis at the left P1-P2/PCA junction. 5. Moderate stenosis at the terminus of the right internal carotid Artery.  2D Echo No intracardiac source of embolism identified  LDL 180  HgbA1c 6.0  VTE prophylaxis - Lovenox  No antithrombotic prior to admission, now on aspirin 81 mg daily and clopidogrel 75 mg daily.   Therapy recommendations:  HH PT/OT  Disposition:  home       ROS:   14 system review of systems performed and negative with exception of those listed in HPI  PMH:  Past Medical History:  Diagnosis Date  . GERD (gastroesophageal reflux disease)     PSH:  Past Surgical History:  Procedure Laterality Date  . COLON SURGERY  2012  . implantable loop recorder placement  11/02/2020   Medtronic Reveal Linq model [id number] implantable loop recorder (SN [id number]) implanted by Dr Johney Frame for cryptogenic stroke    Social History:  Social History   Socioeconomic History  . Marital status: Widowed    Spouse name: Not on file  . Number of children: 7  . Years of education: Not on file  . Highest education level: Not on file  Occupational History  . Not on file  Tobacco Use  . Smoking status: Never Smoker  . Smokeless tobacco: Never Used  Substance and Sexual Activity  . Alcohol use: Not Currently    Comment: was a little bit of red wine   . Drug use: Never  . Sexual activity: Not on file  Other Topics Concern  . Not on file  Social History Narrative   Lives with her youngest son   Right handed   Caffeine: 1 cup coffee every 2-3 days   Social Determinants of Health   Financial Resource Strain: Not on file  Food Insecurity: Not on file  Transportation Needs: Not on file  Physical Activity: Not on file  Stress: Not on file  Social Connections: Not on file  Intimate Partner Violence: Not on file    Family History:  Family History  Problem  Relation Age of Onset  . Stroke Neg Hx     Medications:   Current Outpatient Medications on File Prior to Visit  Medication Sig Dispense Refill  . apixaban (ELIQUIS) 2.5 MG TABS tablet Take 1 tablet (2.5 mg total) by mouth 2 (two) times daily. 180 tablet 2  . atorvastatin (LIPITOR) 40 MG tablet Take 1 tablet (40 mg total) by mouth daily. 30 tablet 2  . Multiple Vitamins-Minerals (MULTIVITAMIN ADULTS 50+) TABS Take by mouth.    . pantoprazole (PROTONIX) 40 MG tablet TAKE ONE TABLET TWICE DAILY 30 MINUTES BEFORE AM/PM MEALS     No current facility-administered medications on file prior to visit.    Allergies:  No Known Allergies    OBJECTIVE:  Physical Exam  Vitals:   02/08/21 0930  BP: 122/70  Pulse: (!) 51  Weight: 112 lb (50.8 kg)  Height: 5\' 1"  (1.549 m)   Body mass index is 21.16 kg/m. No exam data present  General: well developed, well nourished,  frail pleasant elderly female, seated, in no evident distress Head: head normocephalic and atraumatic.  Neck: supple with no carotid or supraclavicular bruits Cardiovascular: regular rate and rhythm, no murmurs Musculoskeletal: no deformity Skin:  no rash/petichiae Vascular:  Normal pulses all extremities   Neurologic Exam Mental Status: Awake and fully alert. Primary language of [place] although denies speech or language difficulty.  Oriented to place and time. Recent memory mildly impaired (at pre-stroke baseline) and remote memory intact. Attention span, concentration and fund of knowledge appropriate. Mood and affect appropriate.  Cranial Nerves: Pupils equal, briskly reactive to light. Extraocular movements full without nystagmus. Visual fields full to confrontation. Hearing intact. Facial sensation intact.  Very slight mild right facial weakness when smiling.  Tongue and palate moves normally and symmetrically.  Motor: Normal bulk and tone and strength left upper and lower extremity. RUE: 4/5 with slightly  decreased grip strength; RLE: 5/5 Sensory.: intact to touch , pinprick , position and vibratory sensation.  Coordination: Rapid alternating movements decreased right hand. Finger-to-nose and heel-to-shin performed accurately bilaterally.  Mildly orbits Dr. Lucia Gaskins over right arm Gait and Station: Arises from chair without difficulty. Stance is normal. Gait demonstrates normal stride length and balance without use of assistive device.  Able to tandem walk and heel toe without difficulty. Reflexes: 1+ and symmetric. Toes downgoing.       ASSESSMENT: Kelly [name] is a 87 y.o. year old female presented on 09/21/2020 with 5 day hx of right-sided weakness and facial droop with stroke work-up revealing L BG and CR infarct, embolic secondary to unknown source although evidence of multifocal intracranial stenosis and occlusions (L MCA M1 occlusion, mid BA and L P1/P2 moderate stenosis).  Recommended placement of loop recorder outpatient to further assess for atrial fibrillation.  Vascular risk factors include HTN, HLD, pre-DM and advanced age.      PLAN:  1. L BG and CR stroke :  a. Residual deficit: RUE weakness but overall greatly improving.  Encouraged continued exercises at home for hopeful further recovery b. ILR evidence of atrial fibrillation on 2/3 possibly stroke etiology/contributing factor c. Continue Eliquis and restart atorvastatin 40 mg daily for secondary stroke prevention.   d. Discussed secondary stroke prevention measures and importance of close PCP follow up for aggressive stroke risk factor management - discussed importance of scheduling f/u with PCP 2. Atrial fibrillation, new dx: Evidence on ILR -CHA2DS2-VASc score of at least 5 indicating a 7.2% annual stroke risk.  Continue Eliquis 2.5 mg twice daily (age, weight) with routine follow-up with cardiology  3. HTN: BP goal <130/90.  Well-controlled 4. HLD: LDL goal <70. Recent LDL 82 down from 180 on atorvastatin 40 mg daily -refill  provided   Follow up in 6 months or call earlier if needed  CC:  [facility] provider: Dr. Anselm Jungling, [facility]     Ihor Austin, [facility]  [facility] [address] [place], [place] [address]  Phone [phone] Fax [phone] Note: This document was prepared with digital dictation and possible smart phrase technology. Any transcriptional errors that result from this process are unintentional.

## 2021-02-08 NOTE — Progress Notes (Signed)
I agree with the above plan 

## 2021-02-08 NOTE — Patient Instructions (Signed)
Continue Eliquis (apixaban) daily  and restart atorvastatin  for secondary stroke prevention  Continue to follow with cardiology for atrial fibrillation and eliquis prescribing   Continue to follow up with PCP regarding cholesterol and blood pressure management  Maintain strict control of hypertension with blood pressure goal below 130/90 and cholesterol with LDL cholesterol (bad cholesterol) goal below 70 mg/dL.       Followup in the future with me in 6 months or call earlier if needed       Thank you for coming to see [place] at [facility]. I hope we have been able to provide you high quality care today.  You may receive a patient satisfaction survey over the next few weeks. We would appreciate your feedback and comments so that we may continue to improve ourselves and the health of our patients.

## 2021-02-10 LAB — CUP PACEART REMOTE DEVICE CHECK
Date Time Interrogation Session: 20220510205847
Implantable Pulse Generator Implant Date: 20220131

## 2021-03-02 NOTE — Progress Notes (Signed)
[facility] Summary Report / Loop Recorder 

## 2021-03-08 ENCOUNTER — Ambulatory Visit (HOSPITAL_COMMUNITY): Payer: Medicare Other | Admitting: Physician Assistant

## 2021-03-11 ENCOUNTER — Ambulatory Visit (INDEPENDENT_AMBULATORY_CARE_PROVIDER_SITE_OTHER): Payer: Medicare Other

## 2021-03-11 DIAGNOSIS — I63512 Cerebral infarction due to unspecified occlusion or stenosis of left middle cerebral artery: Secondary | ICD-10-CM | POA: Diagnosis not present

## 2021-03-15 LAB — CUP PACEART REMOTE DEVICE CHECK
Date Time Interrogation Session: 20220612205855
Implantable Pulse Generator Implant Date: 20220131

## 2021-03-23 ENCOUNTER — Emergency Department (HOSPITAL_BASED_OUTPATIENT_CLINIC_OR_DEPARTMENT_OTHER): Payer: Medicare Other

## 2021-03-23 ENCOUNTER — Encounter (HOSPITAL_BASED_OUTPATIENT_CLINIC_OR_DEPARTMENT_OTHER): Payer: Self-pay | Admitting: Emergency Medicine

## 2021-03-23 ENCOUNTER — Emergency Department (HOSPITAL_BASED_OUTPATIENT_CLINIC_OR_DEPARTMENT_OTHER)
Admission: EM | Admit: 2021-03-23 | Discharge: 2021-03-24 | Disposition: A | Discharge status: Home / Self Care | Payer: Medicare Other | Attending: Emergency Medicine | Admitting: Emergency Medicine

## 2021-03-23 ENCOUNTER — Other Ambulatory Visit: Payer: Self-pay

## 2021-03-23 DIAGNOSIS — S0083XA Contusion of other part of head, initial encounter: Secondary | ICD-10-CM | POA: Diagnosis not present

## 2021-03-23 DIAGNOSIS — W19XXXA Unspecified fall, initial encounter: Secondary | ICD-10-CM

## 2021-03-23 DIAGNOSIS — Z7901 Long term (current) use of anticoagulants: Secondary | ICD-10-CM | POA: Insufficient documentation

## 2021-03-23 DIAGNOSIS — Z23 Encounter for immunization: Secondary | ICD-10-CM | POA: Diagnosis not present

## 2021-03-23 DIAGNOSIS — I4891 Unspecified atrial fibrillation: Secondary | ICD-10-CM | POA: Insufficient documentation

## 2021-03-23 DIAGNOSIS — Y92002 Bathroom of unspecified non-institutional (private) residence single-family (private) house as the place of occurrence of the external cause: Secondary | ICD-10-CM | POA: Diagnosis not present

## 2021-03-23 DIAGNOSIS — W182XXA Fall in (into) shower or empty bathtub, initial encounter: Secondary | ICD-10-CM | POA: Insufficient documentation

## 2021-03-23 DIAGNOSIS — S51011A Laceration without foreign body of right elbow, initial encounter: Secondary | ICD-10-CM | POA: Insufficient documentation

## 2021-03-23 DIAGNOSIS — S59901A Unspecified injury of right elbow, initial encounter: Secondary | ICD-10-CM | POA: Diagnosis present

## 2021-03-23 HISTORY — DX: Pure hypercholesterolemia, unspecified: E78.00

## 2021-03-23 HISTORY — DX: Cerebral infarction, unspecified: I63.9

## 2021-03-23 MED ORDER — TETANUS-DIPHTH-ACELL PERTUSSIS 5-2.5-18.5 LF-MCG/0.5 IM SUSY
0.5000 mL | PREFILLED_SYRINGE | Freq: Once | INTRAMUSCULAR | Status: AC
  Administered 2021-03-23: 0.5 mL via INTRAMUSCULAR
  Filled 2021-03-23: qty 0.5

## 2021-03-23 MED ORDER — LIDOCAINE-EPINEPHRINE (PF) 2 %-1:200000 IJ SOLN
20.0000 mL | Freq: Once | INTRAMUSCULAR | Status: AC
  Administered 2021-03-23: 20 mL via INTRADERMAL
  Filled 2021-03-23: qty 20

## 2021-03-23 NOTE — ED Provider Notes (Signed)
[facility] EMERGENCY DEPARTMENT Provider Note  CSN: [id number] Arrival date & time: 03/23/21 1946  Chief Complaint(s) Fall  HPI Kelly [name] is a 87 y.o. female here for a fall at home while in the shower.  Patiently reportedly slipped causing her to fall forward onto both elbows.  Patient also reports hitting her face on the shower wall.  She sustained laceration to the right elbow.  Contusion to the right forehead upper lip.  Patient denies any loss of consciousness.  No headache.  No neck pain or back pain.  No chest pain.  No abdominal pain.  No hip pain.  No other extremity pain.  Unsure of tetanus status.  Incident occurred around 3 PM this afternoon.  The history is provided by the patient and a relative.   Past Medical History Past Medical History:  Diagnosis Date   GERD (gastroesophageal reflux disease)    High cholesterol    Stroke [facility])    Patient Active Problem List   Diagnosis Date Noted   Paroxysmal atrial fibrillation (HCC) 11/09/2020   Secondary hypercoagulable state (HCC) 11/09/2020   Acute ischemic left MCA stroke (HCC) 10/01/2020   GERD (gastroesophageal reflux disease) 09/30/2020   Prediabetes 09/30/2020   Elevated blood pressure reading 09/30/2020   CVA (cerebral vascular accident) (HCC) 09/29/2020   Home Medication(s) Prior to Admission medications   Medication Sig Start Date End Date Taking? Authorizing Provider  cephALEXin (KEFLEX) 250 MG capsule Take 1 capsule (250 mg total) by mouth 3 (three) times daily for 5 days. 03/24/21 03/29/21 Yes Debbera Wolken, Amadeo Garnet, MD  apixaban (ELIQUIS) 2.5 MG TABS tablet Take 1 tablet (2.5 mg total) by mouth 2 (two) times daily. 12/07/20   Fenton, Clint R, PA  atorvastatin (LIPITOR) 40 MG tablet Take 1 tablet (40 mg total) by mouth daily. 02/08/21   Ihor Austin, NP  Multiple Vitamins-Minerals (MULTIVITAMIN ADULTS 50+) TABS Take by mouth.    [provider]  pantoprazole (PROTONIX) 40 MG tablet TAKE ONE  TABLET TWICE DAILY 30 MINUTES BEFORE AM/PM MEALS 11/11/19   [provider]                                                                                                                                    Past Surgical History Past Surgical History:  Procedure Laterality Date   COLON SURGERY  2012   implantable loop recorder placement  11/02/2020   Medtronic Reveal Linq model [id number] implantable loop recorder (SN [id number]) implanted by Dr Johney Frame for cryptogenic stroke   Family History Family History  Problem Relation Age of Onset   Stroke Neg Hx     Social History Social History   Tobacco Use   Smoking status: Never   Smokeless tobacco: Never  Vaping Use   Vaping Use: Never used  Substance Use Topics   Alcohol use: Not Currently    Comment: was a little bit of red wine  Drug use: Never   Allergies Patient has no known allergies.  Review of Systems Review of Systems All other systems are reviewed and are negative for acute change except as noted in the HPI  Physical Exam Vital Signs  I have reviewed the triage vital signs BP (!) 162/101 (BP Location: Right Arm)   Pulse 66   Temp 98.2 F (36.8 C) (Oral)   Resp 20   Ht  (1.549 m)   Wt 49.9 kg   SpO2 99%   BMI 20.78 kg/m   Physical Exam Constitutional:      General: She is not in acute distress.    Appearance: She is well-developed. She is not diaphoretic.  HENT:     Head: Normocephalic. Contusion present.      Right Ear: External ear normal.     Left Ear: External ear normal.     Nose: Nose normal.  Eyes:     General: No scleral icterus.       Right eye: No discharge.        Left eye: No discharge.     Conjunctiva/sclera: Conjunctivae normal.     Pupils: Pupils are equal, round, and reactive to light.  Cardiovascular:     Rate and Rhythm: Normal rate and regular rhythm.     Pulses:          Radial pulses are 2+ on the right side and 2+ on the left side.       Dorsalis pedis pulses are  2+ on the right side and 2+ on the left side.     Heart sounds: Normal heart sounds. No murmur heard.   No friction rub. No gallop.  Pulmonary:     Effort: Pulmonary effort is normal. No respiratory distress.     Breath sounds: Normal breath sounds. No stridor. No wheezing.  Abdominal:     General: There is no distension.     Palpations: Abdomen is soft.     Tenderness: There is no abdominal tenderness.  Musculoskeletal:        General: No tenderness.     Right elbow: Laceration (1.2 cm laceration over the olecranon process) present.     Cervical back: Normal range of motion and neck supple. No bony tenderness.     Thoracic back: No bony tenderness.     Lumbar back: No bony tenderness.     Comments: Clavicles stable. Chest stable to AP/Lat compression. Pelvis stable to Lat compression. No obvious extremity deformity. No chest or abdominal wall contusion.  Skin:    General: Skin is warm and dry.     Findings: No erythema or rash.  Neurological:     Mental Status: She is alert and oriented to person, place, and time.     Comments: Moving all extremities    ED Results and Treatments Labs (all labs ordered are listed, but only abnormal results are displayed) Labs Reviewed - No data to display  EKG  EKG Interpretation  Date/Time:    Ventricular Rate:    PR Interval:    QRS Duration:   QT Interval:    QTC Calculation:   R Axis:     Text Interpretation:          Radiology DG Elbow Complete Right  Result Date: 03/23/2021 CLINICAL DATA:  Fall in the shower with laceration. EXAM: RIGHT ELBOW - COMPLETE 3+ VIEW COMPARISON:  None. FINDINGS: There is no evidence of fracture, dislocation, or joint effusion. There is no evidence of arthropathy or other focal bone abnormality. Mild posterior soft tissue edema. No radiopaque foreign body. IMPRESSION: Mild posterior  soft tissue edema. No fracture or subluxation. Electronically Signed   By: Narda Rutherford M.D.   On: 03/23/2021 20:28   CT Head Wo Contrast  Result Date: 03/23/2021 CLINICAL DATA:  Head trauma fall EXAM: CT HEAD WITHOUT CONTRAST TECHNIQUE: Contiguous axial images were obtained from the base of the skull through the vertex without intravenous contrast. COMPARISON:  MRI 09/30/2020, CT 09/29/2020 FINDINGS: Brain: No acute territorial infarction, hemorrhage or intracranial mass. Chronic infarct within the left basal ganglia and white matter. Chronic lacunar infarct in the right basal ganglia. Stable ventricle size. Vascular: No hyperdense vessels.  No unexpected calcification Skull: Normal. Negative for fracture or focal lesion. Sinuses/Orbits: No acute finding. Other: None IMPRESSION: 1. No CT evidence for acute intracranial abnormality. 2. Chronic left basal ganglia and white matter infarct. Electronically Signed   By: Jasmine Pang M.D.   On: 03/23/2021 23:27    Pertinent labs & imaging results that were available during my care of the patient were reviewed by me and considered in my medical decision making (see chart for details).  Medications Ordered in ED Medications  lidocaine-EPINEPHrine (XYLOCAINE W/EPI) 2 %-1:200000 (PF) injection 20 mL (20 mLs Intradermal Given by Other 03/23/21 2331)  Tdap (BOOSTRIX) injection 0.5 mL (0.5 mLs Intramuscular Given 03/23/21 2330)  cephALEXin (KEFLEX) capsule 250 mg (250 mg Oral Given 03/24/21 0032)                                                                                                                                    Procedures .Marland KitchenLaceration Repair  Date/Time: 03/24/2021 12:09 AM Performed by: Nira Conn, MD Authorized by: Nira Conn, MD   Consent:    Consent obtained:  Verbal   Consent given by:  Patient and healthcare agent   Risks discussed:  Infection, pain, poor cosmetic result and poor wound healing   Alternatives  discussed:  Delayed treatment Universal protocol:    Procedure explained and questions answered to patient or proxy's satisfaction: yes     Immediately prior to procedure, a time out was called: yes     Patient identity confirmed:  Verbally with patient and arm band Laceration details:    Location:  Shoulder/arm   Shoulder/arm location:  R elbow   Length (cm):  1.2   Depth (mm):  4 Pre-procedure details:    Preparation:  Patient was prepped and draped in usual sterile fashion and imaging obtained to evaluate for foreign bodies Exploration:    Hemostasis achieved with:  Direct pressure   Imaging outcome: foreign body not noted     Wound exploration: wound explored through full range of motion     Contaminated: no   Treatment:    Area cleansed with:  Saline   Amount of cleaning:  Standard   Irrigation solution:  Sterile saline   Irrigation method:  Pressure wash   Debridement:  None Skin repair:    Repair method:  Sutures   Suture size:  4-0   Wound skin closure material used: ethilon.   Suture technique:  Horizontal mattress   Number of sutures:  1 Approximation:    Approximation:  Close Repair type:    Repair type:  Simple Post-procedure details:    Dressing:  Non-adherent dressing   Procedure completion:  Tolerated  (including critical care time)  Medical Decision Making / ED Course I have reviewed the nursing notes for this encounter and the patient's prior records (if available in EHR or on provided paperwork).   Britny Riel was evaluated in Emergency Department on 03/24/2021 for the symptoms described in the history of present illness. She was evaluated in the context of the global COVID-19 pandemic, which necessitated consideration that the patient might be at risk for infection with the SARS-CoV-2 virus that causes COVID-19. Institutional protocols and algorithms that pertain to the evaluation of patients at risk for COVID-19 are in a state of rapid change based on  information released by regulatory bodies including the CDC and federal and state organizations. These policies and algorithms were followed during the patient's care in the ED.  Follow-up home resulting in minor head trauma and laceration to the right elbow.  Patient has a history of A. fib and is on Eliquis.  CT head obtained and negative for ICH.  Right leg contusion without laceration.  Right elbow plain film negative for fracture.  Laceration was thoroughly irrigated and closed as above.  Patient tetanus is updated.  Given the prolonged time since presentation location over bursa, prophylactic Keflex provided.      Final Clinical Impression(s) / ED Diagnoses Final diagnoses:  Fall in home, initial encounter  Contusion of other part of head, initial encounter  Laceration of right elbow, initial encounter   The patient appears reasonably screened and/or stabilized for discharge and I doubt any other medical condition or other [facility] requiring further screening, evaluation, or treatment in the ED at this time prior to discharge. Safe for discharge with strict return precautions.  Disposition: Discharge  Condition: Good  I have discussed the results, Dx and Tx plan with the patient/family who expressed understanding and agree(s) with the plan. Discharge instructions discussed at length. The patient/family was given strict return precautions who verbalized understanding of the instructions. No further questions at time of discharge.    ED Discharge Orders          Ordered    cephALEXin (KEFLEX) 250 MG capsule  3 times daily        03/24/21 0012              Follow Up: [facility] [address] [id number] mc [place] [address] [phone]  for suture removal in 10-14 days      This chart was dictated using voice  recognition software.  Despite best efforts to proofread,  errors can occur which can change the  documentation meaning.    Nira Conn, MD 03/24/21 [phone]

## 2021-03-23 NOTE — ED Triage Notes (Signed)
Per family pt fell in the shower this afternoon around 3pm  Pt has an injury to her right elbow and states she hit her mouth also when she fell  Pt does not speak [place]  Family with pt

## 2021-03-24 DIAGNOSIS — S51011A Laceration without foreign body of right elbow, initial encounter: Secondary | ICD-10-CM | POA: Diagnosis not present

## 2021-03-24 MED ORDER — CEPHALEXIN 250 MG PO CAPS: 250.0000 mg | ORAL_CAPSULE | Freq: Three times a day (TID) | ORAL | 0 refills | Status: AC

## 2021-03-24 MED ORDER — CEPHALEXIN 250 MG PO CAPS
250.0000 mg | ORAL_CAPSULE | Freq: Once | ORAL | Status: AC
  Administered 2021-03-24: 01:00:00 250 mg via ORAL
  Filled 2021-03-24: qty 1

## 2021-04-02 NOTE — Progress Notes (Signed)
[facility] Summary Report / Loop Recorder 

## 2021-04-12 ENCOUNTER — Ambulatory Visit (INDEPENDENT_AMBULATORY_CARE_PROVIDER_SITE_OTHER): Payer: Medicare Other

## 2021-04-12 DIAGNOSIS — I63512 Cerebral infarction due to unspecified occlusion or stenosis of left middle cerebral artery: Secondary | ICD-10-CM | POA: Diagnosis not present

## 2021-04-19 LAB — CUP PACEART REMOTE DEVICE CHECK
Date Time Interrogation Session: 20220717000022
Implantable Pulse Generator Implant Date: 20220131

## 2021-05-05 NOTE — Progress Notes (Signed)
[facility] Summary Report / Loop Recorder 

## 2021-05-20 ENCOUNTER — Ambulatory Visit (INDEPENDENT_AMBULATORY_CARE_PROVIDER_SITE_OTHER): Payer: Medicare Other

## 2021-05-20 DIAGNOSIS — I63512 Cerebral infarction due to unspecified occlusion or stenosis of left middle cerebral artery: Secondary | ICD-10-CM | POA: Diagnosis not present

## 2021-05-20 LAB — CUP PACEART REMOTE DEVICE CHECK
Date Time Interrogation Session: 20220817205605
Implantable Pulse Generator Implant Date: 20220131

## 2021-06-09 NOTE — Progress Notes (Signed)
[facility] Summary Report / Loop Recorder 

## 2021-07-19 ENCOUNTER — Other Ambulatory Visit (HOSPITAL_COMMUNITY): Payer: Self-pay | Admitting: Physician Assistant

## 2021-07-21 ENCOUNTER — Other Ambulatory Visit (HOSPITAL_COMMUNITY): Payer: Self-pay | Admitting: Physician Assistant

## 2021-08-16 ENCOUNTER — Ambulatory Visit: Payer: Medicare Other | Admitting: Adult Health

## 2022-02-08 ENCOUNTER — Other Ambulatory Visit: Payer: Self-pay | Admitting: *Deleted

## 2022-02-08 MED ORDER — ATORVASTATIN CALCIUM 40 MG PO TABS: 40.0000 mg | ORAL_TABLET | Freq: Every day | ORAL | 0 refills | Status: AC

## 2022-03-14 ENCOUNTER — Ambulatory Visit (INDEPENDENT_AMBULATORY_CARE_PROVIDER_SITE_OTHER): Payer: Medicare Other

## 2022-03-14 DIAGNOSIS — I63512 Cerebral infarction due to unspecified occlusion or stenosis of left middle cerebral artery: Secondary | ICD-10-CM | POA: Diagnosis not present

## 2022-03-16 LAB — CUP PACEART REMOTE DEVICE CHECK
Date Time Interrogation Session: 20230610205849
Implantable Pulse Generator Implant Date: 20220131

## 2022-03-28 ENCOUNTER — Telehealth: Payer: Self-pay

## 2022-03-29 NOTE — Telephone Encounter (Signed)
Patient is back in SR. Continue to monitor.

## 2022-03-30 NOTE — Progress Notes (Signed)
[facility] Summary Report / Loop Recorder 

## 2022-04-15 ENCOUNTER — Ambulatory Visit (INDEPENDENT_AMBULATORY_CARE_PROVIDER_SITE_OTHER): Payer: Medicare Other

## 2022-04-15 DIAGNOSIS — I63512 Cerebral infarction due to unspecified occlusion or stenosis of left middle cerebral artery: Secondary | ICD-10-CM | POA: Diagnosis not present

## 2022-04-15 LAB — CUP PACEART REMOTE DEVICE CHECK
Date Time Interrogation Session: 20230713210006
Implantable Pulse Generator Implant Date: 20220131

## 2022-04-28 NOTE — Progress Notes (Signed)
[facility] Summary Report / Loop Recorder 

## 2022-05-11 ENCOUNTER — Other Ambulatory Visit (HOSPITAL_COMMUNITY): Payer: Self-pay | Admitting: *Deleted

## 2022-05-11 MED ORDER — APIXABAN 2.5 MG PO TABS: ORAL_TABLET | ORAL | 0 refills | Status: AC

## 2022-05-18 ENCOUNTER — Ambulatory Visit (INDEPENDENT_AMBULATORY_CARE_PROVIDER_SITE_OTHER): Payer: Medicare Other

## 2022-05-18 DIAGNOSIS — I63512 Cerebral infarction due to unspecified occlusion or stenosis of left middle cerebral artery: Secondary | ICD-10-CM

## 2022-05-18 LAB — CUP PACEART REMOTE DEVICE CHECK
Date Time Interrogation Session: 20230815205852
Implantable Pulse Generator Implant Date: 20220131

## 2022-06-16 NOTE — Progress Notes (Signed)
[facility] Summary Report / Loop Recorder 

## 2022-06-20 ENCOUNTER — Ambulatory Visit (INDEPENDENT_AMBULATORY_CARE_PROVIDER_SITE_OTHER): Payer: Medicare Other

## 2022-06-20 DIAGNOSIS — I63512 Cerebral infarction due to unspecified occlusion or stenosis of left middle cerebral artery: Secondary | ICD-10-CM | POA: Diagnosis not present

## 2022-06-21 LAB — CUP PACEART REMOTE DEVICE CHECK
Date Time Interrogation Session: 20230917210111
Implantable Pulse Generator Implant Date: 20220131

## 2022-07-05 NOTE — Progress Notes (Signed)
[facility] Summary Report / Loop Recorder 

## 2022-08-17 IMAGING — MR MR MRA HEAD W/O CM
2 series · 18 of 48 positions shown · non-contrast
Comparison: CT angiogram of the head and neck September 29, 2020.

CLINICAL DATA: Stroke follow-up

EXAM:
MRI HEAD WITHOUT CONTRAST
MRA HEAD WITHOUT CONTRAST
TECHNIQUE: Multiplanar, multiecho pulse sequences of the brain and surrounding
structures were obtained without intravenous contrast. Angiographic
images of the head were obtained using MRA technique without
contrast.

[Series 3: ax (id) · axial · 1.0mm · 0.43mm/px · z∈[-72,+5]mm · 17 of 190 slices shown]
[im 1/190]
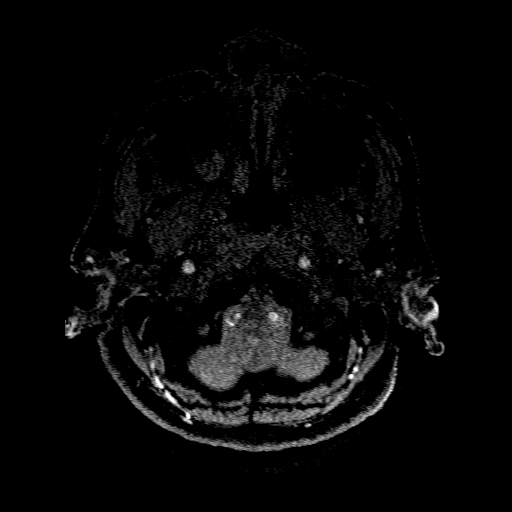
[im 5/190]
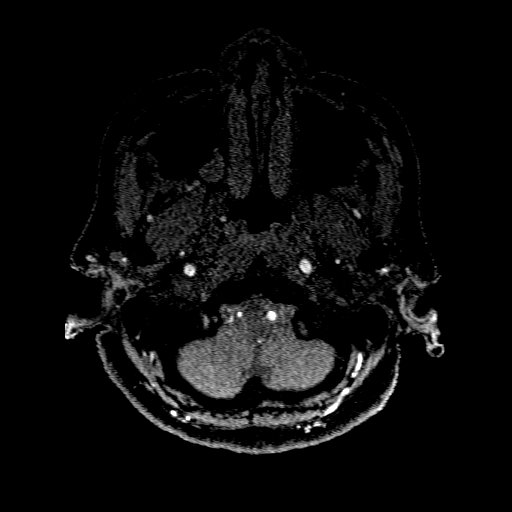
[im 9/190]
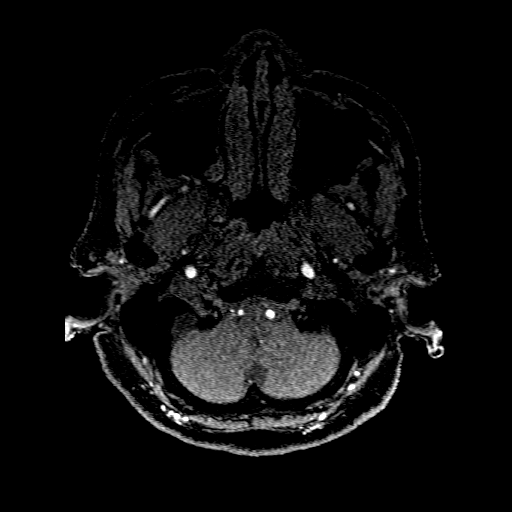
[im 13/190]
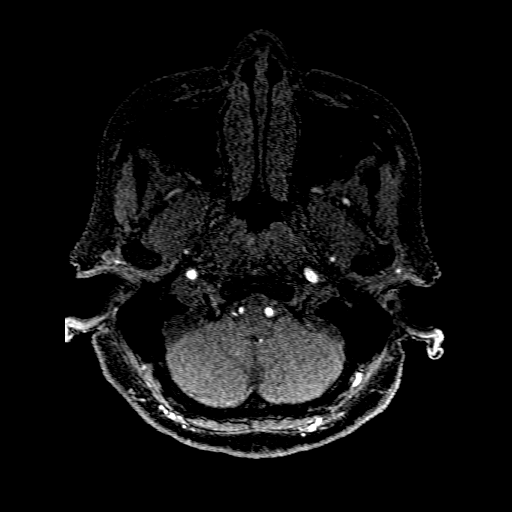
[im 17/190]
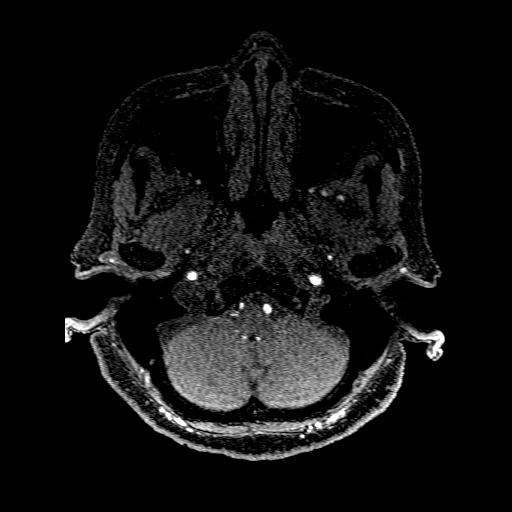
[im 21/190]
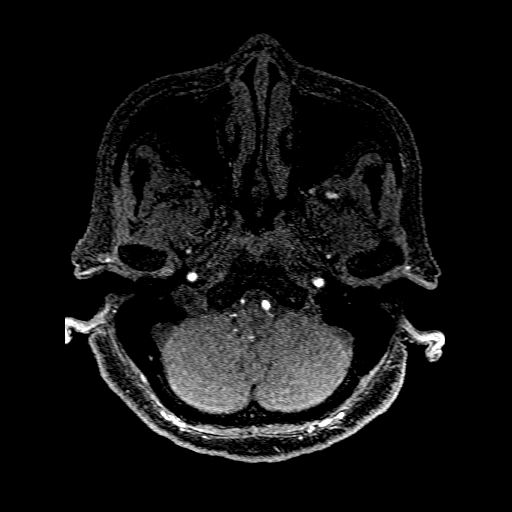
[im 25/190]
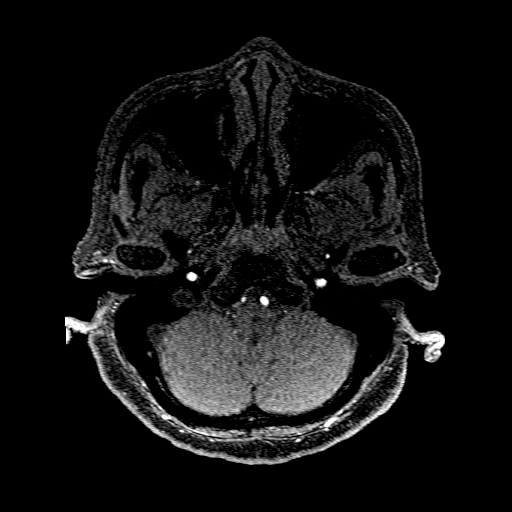
[im 29/190]
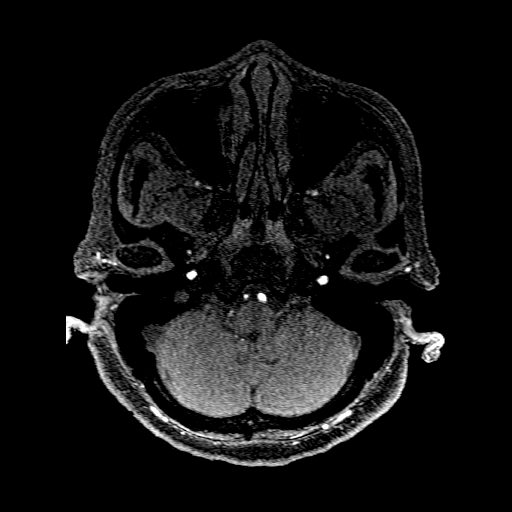
[im 33/190]
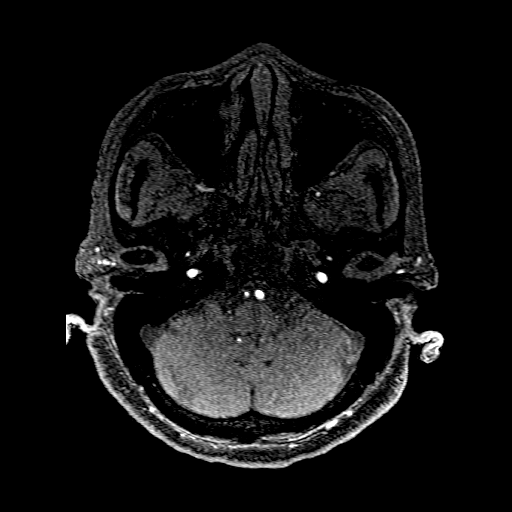
[im 58/190]
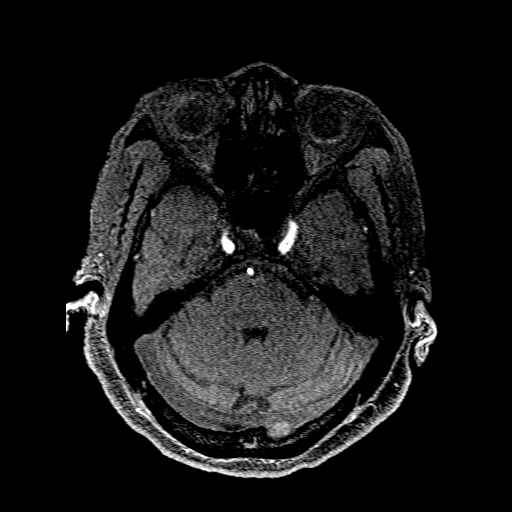
[im 83/190]
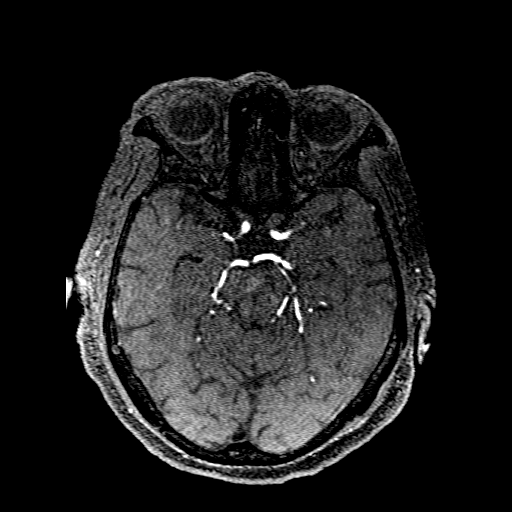
[im 95/190]
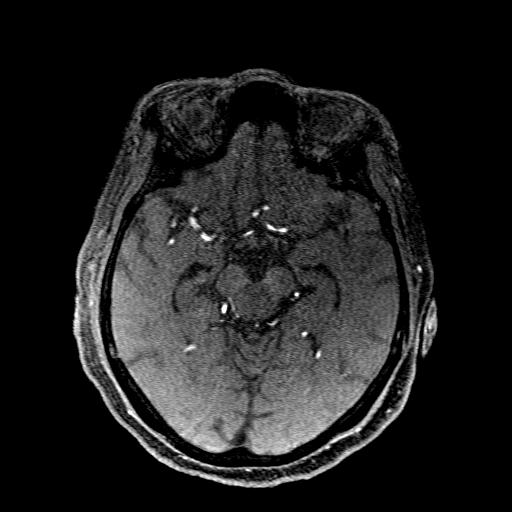
[im 107/190]
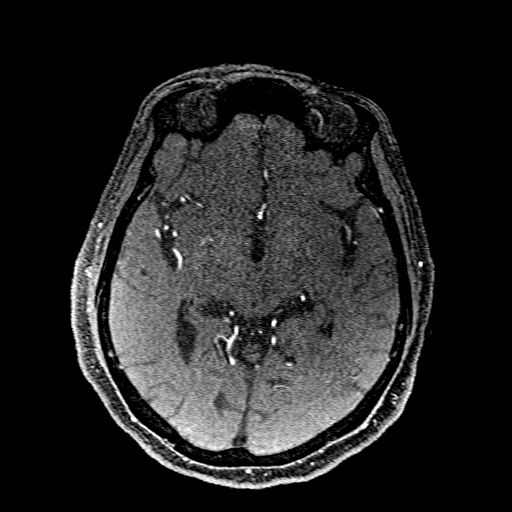
[im 132/190]
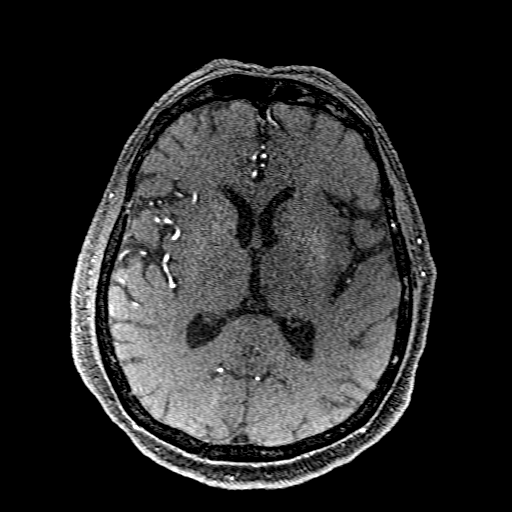
[im 157/190]
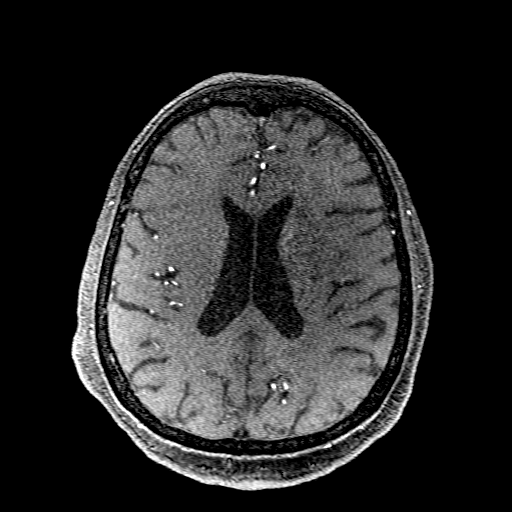
[im 161/190]
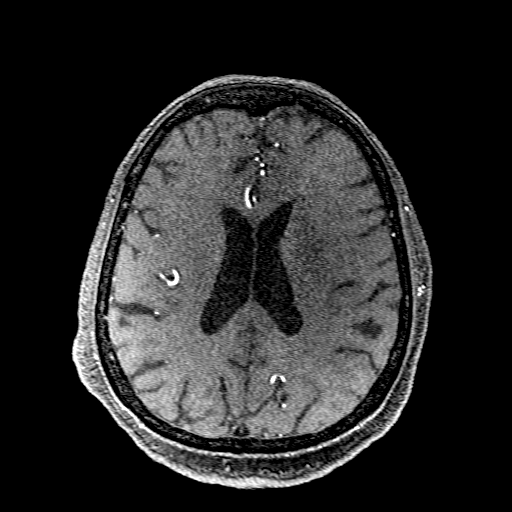
[im 181/190]
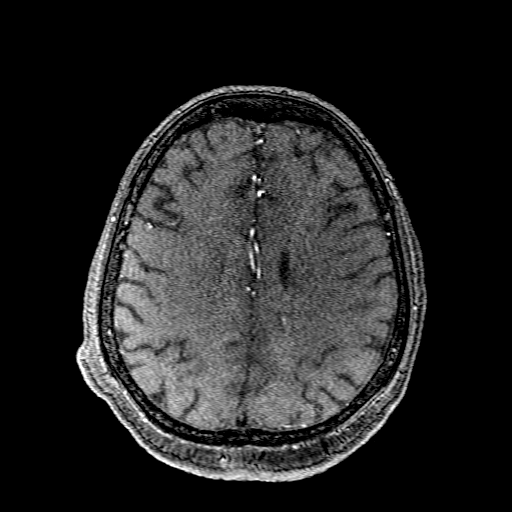

[Series 301: pjn:ax (id) · sagittal · 1.0mm · 0.43mm/px · 1 of 4 slices shown]
[im 1/4]
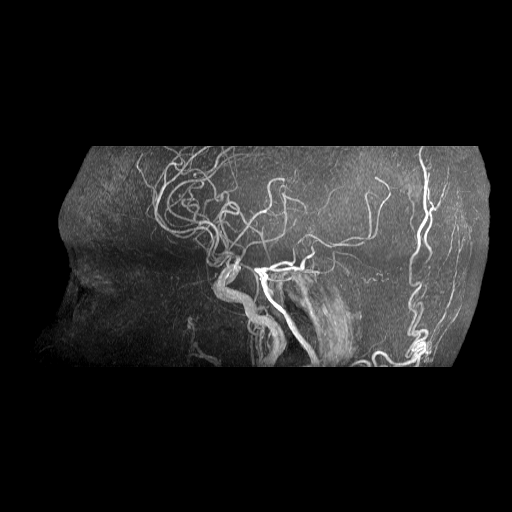

[18 of 48 positions shown; findings below may reference images not displayed]

FINDINGS: MRI HEAD FINDINGS

The study is degraded by motion.

Brain: Area of restricted diffusion is seen involving the basal
ganglia and corona radiata on the left with additional focus of
restricted in the left precentral gyrus.

Scattered foci of T2 hyperintensity are seen within the white matter
of the cerebral hemispheres, nonspecific, most likely related to
chronic microangiopathic changes.

No intracranial hemorrhage, hydrocephalus, extra-axial collection or
mass lesion.

Vascular: Abnormal flow void in the left M1 segment.

Skull and upper cervical spine: Normal marrow signal.

Sinuses/Orbits: Mucous retention cyst in the right maxillary sinus.
The orbits are maintained.

MRA HEAD FINDINGS

Normal flow related enhancement in the upper cervical and
intracranial right internal carotid artery with moderate stenosis at
the terminus.

Normal caliber and flow related enhancement of the upper cervical
and intracranial left internal carotid artery.

Occlusion of the proximal left M1/MCA, as seen on prior CT
angiogram.

Short segment occlusion of the proximal right M2/MCA posterior
division branch.

Dominant left vertebral artery with normal caliber and flow related
enhancement. Hypoplastic right vertebral artery has severe stenosis
at the vertebrobasilar junction. Mild luminal irregularity of the
mid basilar artery may be related to intracranial atherosclerotic
disease without hemodynamically significant stenosis. Luminal
irregularity of the bilateral posterior cerebral arteries,
consistent with intracranial atherosclerotic disease with moderate
stenosis at the left P1-P2/PCA junction.
IMPRESSION: 1. Acute/subacute infarcts involving the left basal ganglia and
corona radiata with additional focus of restricted diffusion in the
left precentral gyrus.
2. Occlusion of the proximal left M1/MCA, as seen on prior CT
angiogram.
3. Short segment occlusion of the proximal right M2/MCA posterior
division branch.
4. Luminal irregularity of the bilateral posterior cerebral
arteries, consistent with intracranial atherosclerotic disease with
moderate stenosis at the left P1-P2/PCA junction.
5. Moderate stenosis at the terminus of the right internal carotid
artery.

## 2022-08-29 ENCOUNTER — Ambulatory Visit (INDEPENDENT_AMBULATORY_CARE_PROVIDER_SITE_OTHER): Payer: Medicare Other

## 2022-08-29 DIAGNOSIS — I63512 Cerebral infarction due to unspecified occlusion or stenosis of left middle cerebral artery: Secondary | ICD-10-CM

## 2022-08-30 LAB — CUP PACEART REMOTE DEVICE CHECK
Date Time Interrogation Session: 20231126231118
Implantable Pulse Generator Implant Date: 20220131

## 2022-10-10 NOTE — Progress Notes (Signed)
[facility] Summary Report / Loop Recorder
